# Patient Record
Sex: Male | Born: 1985 | Race: White | Hispanic: No | Marital: Single | Smoking: Current some day smoker
Health system: Southern US, Community
[De-identification: ages and names within clinical notes are randomized; demographics above are authoritative.]

---
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
## 2007-08-19 ENCOUNTER — Emergency Department (HOSPITAL_COMMUNITY): Admission: EM | Admit: 2007-08-19 | Discharge: 2007-08-19 | Payer: Self-pay | Admitting: Emergency Medicine

## 2016-10-20 ENCOUNTER — Emergency Department (HOSPITAL_COMMUNITY): Payer: Self-pay

## 2016-10-20 ENCOUNTER — Emergency Department (HOSPITAL_COMMUNITY)
Admission: EM | Admit: 2016-10-20 | Discharge: 2016-10-20 | Disposition: A | Discharge status: Home / Self Care | Payer: Self-pay | Attending: Emergency Medicine | Admitting: Emergency Medicine

## 2016-10-20 ENCOUNTER — Encounter (HOSPITAL_COMMUNITY): Payer: Self-pay | Admitting: *Deleted

## 2016-10-20 DIAGNOSIS — R1084 Generalized abdominal pain: Secondary | ICD-10-CM | POA: Insufficient documentation

## 2016-10-20 DIAGNOSIS — R042 Hemoptysis: Secondary | ICD-10-CM | POA: Insufficient documentation

## 2016-10-20 DIAGNOSIS — Z5181 Encounter for therapeutic drug level monitoring: Secondary | ICD-10-CM | POA: Insufficient documentation

## 2016-10-20 DIAGNOSIS — F17200 Nicotine dependence, unspecified, uncomplicated: Secondary | ICD-10-CM | POA: Insufficient documentation

## 2016-10-20 LAB — CBC WITH DIFFERENTIAL/PLATELET
BASOS ABS: 0 10*3/uL (ref 0.0–0.1)
BASOS PCT: 0 %
EOS PCT: 1 %
Eosinophils Absolute: 0.1 10*3/uL (ref 0.0–0.7)
HCT: 44.8 % (ref 39.0–52.0)
Hemoglobin: 15.4 g/dL (ref 13.0–17.0)
LYMPHS PCT: 15 %
Lymphs Abs: 2.1 10*3/uL (ref 0.7–4.0)
MCH: 30.5 pg (ref 26.0–34.0)
MCHC: 34.4 g/dL (ref 30.0–36.0)
MCV: 88.7 fL (ref 78.0–100.0)
MONO ABS: 1.3 10*3/uL — AB (ref 0.1–1.0)
Monocytes Relative: 10 %
Neutro Abs: 10.1 10*3/uL — ABNORMAL HIGH (ref 1.7–7.7)
Neutrophils Relative %: 74 %
PLATELETS: 215 10*3/uL (ref 150–400)
RBC: 5.05 MIL/uL (ref 4.22–5.81)
RDW: 12.1 % (ref 11.5–15.5)
WBC: 13.7 10*3/uL — AB (ref 4.0–10.5)

## 2016-10-20 LAB — COMPREHENSIVE METABOLIC PANEL
ALK PHOS: 56 U/L (ref 38–126)
ALT: 28 U/L (ref 17–63)
ANION GAP: 9 (ref 5–15)
AST: 30 U/L (ref 15–41)
Albumin: 4.3 g/dL (ref 3.5–5.0)
BILIRUBIN TOTAL: 1.1 mg/dL (ref 0.3–1.2)
BUN: 21 mg/dL — AB (ref 6–20)
CALCIUM: 9.1 mg/dL (ref 8.9–10.3)
CO2: 27 mmol/L (ref 22–32)
Chloride: 93 mmol/L — ABNORMAL LOW (ref 101–111)
Creatinine, Ser: 1.01 mg/dL (ref 0.61–1.24)
GFR calc Af Amer: >60 mL/min (ref >60)
Glucose, Bld: 101 mg/dL — ABNORMAL HIGH (ref 65–99)
POTASSIUM: 3.6 mmol/L (ref 3.5–5.1)
Sodium: 129 mmol/L — ABNORMAL LOW (ref 135–145)
TOTAL PROTEIN: 7.6 g/dL (ref 6.5–8.1)

## 2016-10-20 LAB — URINALYSIS, ROUTINE W REFLEX MICROSCOPIC
Bilirubin Urine: NEGATIVE
Glucose, UA: NEGATIVE mg/dL
Hgb urine dipstick: NEGATIVE
Ketones, ur: 20 mg/dL — AB
Leukocytes, UA: NEGATIVE
Nitrite: NEGATIVE
PROTEIN: 30 mg/dL — AB
SPECIFIC GRAVITY, URINE: 1.029 (ref 1.005–1.030)
pH: 5 (ref 5.0–8.0)

## 2016-10-20 LAB — RAPID URINE DRUG SCREEN, HOSP PERFORMED
AMPHETAMINES: POSITIVE — AB
Barbiturates: NOT DETECTED
Benzodiazepines: POSITIVE — AB
COCAINE: POSITIVE — AB
OPIATES: NOT DETECTED
TETRAHYDROCANNABINOL: POSITIVE — AB

## 2016-10-20 NOTE — ED Provider Notes (Addendum)
[facility]-EMER[facility]Y DEPT Provider Note   CSN: [id number] Arrival date & time: 10/20/16  0321  Time seen 03:42 AM   History   Chief Complaint Chief Complaint  Patient presents with  . Hemoptysis    HPI Timothy Black is a 31 y.o. male.  HPI  patient states he was sleeping and his girlfriend states she woke up and had blood all over her and realized he was vomiting or coughing up blood. He states this happened about 2 AM. He complains of chest pain but then he states it's in his upper back. He also has diffuse abdominal cramping. He denies any nosebleed. He states however his teeth and his throat had hurt all day long. He denies blood in his stool. 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 He states he "did it anyway you can do it".  States he hasn't felt well since. His significant other and sister state he's been under a lot of stress. His mother died in December about the same time he got out of prison. He denies epistaxis, coughing, nausea, vomiting, diarrhea.   PCP none  History reviewed. No pertinent past medical history.  There are no active problems to display for this patient.   History reviewed. No pertinent surgical history.     Home Medications    Prior to Admission medications   Not on File    Family History No family history on file.  Social History Social History  Substance Use Topics  . Smoking status: Current Every Day Smoker  . Smokeless tobacco: Never Used  . Alcohol use No  unemployed   Allergies   Patient has no known allergies.   Review of Systems Review of Systems  All other systems reviewed and are negative.    Physical Exam Updated Vital Signs BP 134/90   Pulse 95   Temp 98.2 F (36.8 C) (Oral)   Resp 18   Ht 5\' 11"  (1.803 m)   Wt 175 lb (79.4 kg)   SpO2 100%   BMI 24.41 kg/m   Vital signs normal    Physical Exam  Constitutional: He is oriented to person, place, and time. He appears  well-developed and well-nourished.  Non-toxic appearance. He does not appear ill. No distress.  Pt is sleeping, keeps falling asleep while talking  HENT:  Head: Normocephalic and atraumatic.  Right Ear: External ear normal.  Left Ear: External ear normal.  Nose: Nose normal. No mucosal edema or rhinorrhea.  Mouth/Throat: Oropharynx is clear and moist and mucous membranes are normal. No dental abscesses or uvula swelling.  No obvious sites of bleeding when his nasal septum is observed. There is no obvious blood in his posterior oropharynx. There is no blood in his oral pharynx. Patient is noted to have a large split in his right lower lip.  Eyes: Conjunctivae and EOM are normal. Pupils are equal, round, and reactive to light.  Neck: Normal range of motion and full passive range of motion without pain. Neck supple.  Cardiovascular: Normal rate, regular rhythm and normal heart sounds.  Exam reveals no gallop and no friction rub.   No murmur heard. Pulmonary/Chest: Effort normal and breath sounds normal. No respiratory distress. He has no wheezes. He has no rhonchi. He has no rales. He exhibits no tenderness and no crepitus.  Abdominal: Soft. Normal appearance and bowel sounds are normal. He exhibits no distension. There is generalized tenderness. There is no rebound and no guarding.  Musculoskeletal: Normal range of motion. He exhibits no edema or tenderness.  Moves all extremities well.   Neurological: He is alert and oriented to person, place, and time. He has normal strength. No cranial nerve deficit.  Skin: Skin is warm, dry and intact. No rash noted. No erythema. No pallor.  Psychiatric: He has a normal mood and affect. His speech is normal and behavior is normal. His mood appears not anxious.  Nursing note and vitals reviewed.    ED Treatments / Results  Labs (all labs ordered are listed, but only abnormal results are displayed) Results for orders placed or performed during the hospital  encounter of 10/20/16  Urinalysis, Routine w reflex microscopic  Result Value Ref Range   Color, Urine AMBER (A) YELLOW   APPearance CLOUDY (A) CLEAR   Specific Gravity, Urine 1.029 1.005 - 1.030   pH 5.0 5.0 - 8.0   Glucose, UA NEGATIVE NEGATIVE mg/dL   Hgb urine dipstick NEGATIVE NEGATIVE   Bilirubin Urine NEGATIVE NEGATIVE   Ketones, ur 20 (A) NEGATIVE mg/dL   Protein, ur 30 (A) NEGATIVE mg/dL   Nitrite NEGATIVE NEGATIVE   Leukocytes, UA NEGATIVE NEGATIVE   RBC / HPF 6-30 0 - 5 RBC/hpf   WBC, UA 6-30 0 - 5 WBC/hpf   Bacteria, UA RARE (A) NONE SEEN   Sperm, UA PRESENT   Urine rapid drug screen (hosp performed)  Result Value Ref Range   Opiates NONE DETECTED NONE DETECTED   Cocaine POSITIVE (A) NONE DETECTED   Benzodiazepines POSITIVE (A) NONE DETECTED   Amphetamines POSITIVE (A) NONE DETECTED   Tetrahydrocannabinol POSITIVE (A) NONE DETECTED   Barbiturates NONE DETECTED NONE DETECTED  Comprehensive metabolic panel  Result Value Ref Range   Sodium 129 (L) 135 - 145 mmol/L   Potassium 3.6 3.5 - 5.1 mmol/L   Chloride 93 (L) 101 - 111 mmol/L   CO2 27 22 - 32 mmol/L   Glucose, Bld 101 (H) 65 - 99 mg/dL   BUN 21 (H) 6 - 20 mg/dL   Creatinine, Ser 1.61 0.61 - 1.24 mg/dL   Calcium 9.1 8.9 - 09.6 mg/dL   Total Protein 7.6 6.5 - 8.1 g/dL   Albumin 4.3 3.5 - 5.0 g/dL   AST 30 15 - 41 U/L   ALT 28 17 - 63 U/L   Alkaline Phosphatase 56 38 - 126 U/L   Total Bilirubin 1.1 0.3 - 1.2 mg/dL   GFR calc non Af Amer >60 >60 mL/min   GFR calc Af Amer >60 >60 mL/min   Anion gap 9 5 - 15  CBC with Differential  Result Value Ref Range   WBC 13.7 (H) 4.0 - 10.5 K/uL   RBC 5.05 4.22 - 5.81 MIL/uL   Hemoglobin 15.4 13.0 - 17.0 g/dL   HCT 04.5 40.9 - 81.1 %   MCV 88.7 78.0 - 100.0 fL   MCH 30.5 26.0 - 34.0 pg   MCHC 34.4 30.0 - 36.0 g/dL   RDW 91.4 78.2 - 95.6 %   Platelets 215 150 - 400 K/uL   Neutrophils Relative % 74 %   Neutro Abs 10.1 (H) 1.7 - 7.7 K/uL   Lymphocytes Relative 15 %    Lymphs Abs 2.1 0.7 - 4.0 K/uL   Monocytes Relative 10 %   Monocytes Absolute 1.3 (H) 0.1 - 1.0 K/uL   Eosinophils Relative 1 %   Eosinophils Absolute 0.1 0.0 - 0.7 K/uL   Basophils Relative 0 %   Basophils Absolute  0.0 0.0 - 0.1 K/uL  hemoccult negative per his nurse   Laboratory interpretation all normal except leukocytosis, mild hyponatremia, + UDS    EKG  EKG Interpretation None       Radiology Dg Chest 2 View  Result Date: 10/20/2016 CLINICAL DATA:  31 y/o M; coughing up blood, chest pain, abdominal pain. EXAM: CHEST  2 VIEW COMPARISON:  08/18/2011 chest radiograph. FINDINGS: The heart size and mediastinal contours are within normal limits. Right costal diaphragmatic angle opacity is probably a nipple shadow. Both lungs are clear. The visualized skeletal structures are unremarkable. IMPRESSION: No active cardiopulmonary disease. Electronically Signed   By: Mitzi Hansen M.D.   On: 10/20/2016 06:17   Dg Abd 2 Views  Result Date: 10/20/2016 CLINICAL DATA:  31 y/o M; coughing up blood, chest pain, abdominal pain. EXAM: ABDOMEN - 2 VIEW COMPARISON:  11/10/2015 pelvis radiographs. FINDINGS: The bowel gas pattern is normal. There is no evidence of free air. No radio-opaque calculi or other significant radiographic abnormality is seen. IMPRESSION: Negative. Electronically Signed   By: Mitzi Hansen M.D.   On: 10/20/2016 06:16    Procedures Procedures (including critical care time)  Medications Ordered in ED Medications - No data to display   Initial Impression / Assessment and Plan / ED Course  I have reviewed the triage vital signs and the nursing notes.  Pertinent labs & imaging results that were available during my care of the patient were reviewed by me and considered in my medical decision making (see chart for details).   Recheck at 06:30 am patient has been sleeping his whole visit. No more bleeding. States he feels better, tests are normal. I  still suspect the blood is from the split in his right lower lip.    Final Clinical Impressions(s) / ED Diagnoses   Final diagnoses:  Coughing up blood    Plan discharge  Devoria Albe, MD, Concha Pyo, MD 10/20/16 0700    Devoria Albe, MD 10/20/16 [phone]

## 2016-10-20 NOTE — ED Triage Notes (Signed)
Pt c/o waking up to coughing blood, denies any fever, states that he was not sick until tonight,

## 2016-10-20 NOTE — Discharge Instructions (Signed)
Drink plenty of fluids. Use lip balm on the split of your lower lip. Recheck if you seem worse.

## 2019-05-30 ENCOUNTER — Encounter (HOSPITAL_COMMUNITY): Payer: Self-pay

## 2019-05-30 ENCOUNTER — Emergency Department (HOSPITAL_COMMUNITY): Payer: Self-pay

## 2019-05-30 ENCOUNTER — Emergency Department (HOSPITAL_COMMUNITY)
Admission: EM | Admit: 2019-05-30 | Discharge: 2019-05-30 | Discharge status: Against Medical Advice | Payer: Self-pay | Attending: Emergency Medicine | Admitting: Emergency Medicine

## 2019-05-30 ENCOUNTER — Other Ambulatory Visit: Payer: Self-pay

## 2019-05-30 DIAGNOSIS — F17200 Nicotine dependence, unspecified, uncomplicated: Secondary | ICD-10-CM | POA: Insufficient documentation

## 2019-05-30 DIAGNOSIS — Z5329 Procedure and treatment not carried out because of patient's decision for other reasons: Secondary | ICD-10-CM | POA: Insufficient documentation

## 2019-05-30 DIAGNOSIS — L03115 Cellulitis of right lower limb: Secondary | ICD-10-CM | POA: Insufficient documentation

## 2019-05-30 MED ORDER — IBUPROFEN 400 MG PO TABS
400.0000 mg | ORAL_TABLET | Freq: Once | ORAL | Status: AC
  Administered 2019-05-30: 400 mg via ORAL
  Filled 2019-05-30: qty 1

## 2019-05-30 MED ORDER — ACETAMINOPHEN 325 MG PO TABS
650.0000 mg | ORAL_TABLET | Freq: Once | ORAL | Status: AC
  Administered 2019-05-30: 650 mg via ORAL
  Filled 2019-05-30: qty 2

## 2019-05-30 MED ORDER — CLINDAMYCIN HCL 150 MG PO CAPS
450.0000 mg | ORAL_CAPSULE | Freq: Once | ORAL | Status: AC
  Administered 2019-05-30: 450 mg via ORAL
  Filled 2019-05-30: qty 3

## 2019-05-30 NOTE — ED Notes (Signed)
Right foot soak in [facility].

## 2019-05-30 NOTE — ED Notes (Signed)
Pt is wanting to leave.  Says his brother is about to leave him and he on house arrest.  Medications given as ordered.  Eplained to pt the importance of staying to complete his treated.  Pt says he has to go and signed out AMA.  Dr Thurnell Garbe and charge nurse informed.

## 2019-05-30 NOTE — ED Triage Notes (Signed)
Pt reports rash and wounds to r foot.  Redness and swelling noted to r foot.  Pt says went to [facility] and was given a cream for his foot but was unable to get it filled.

## 2019-05-30 NOTE — ED Provider Notes (Signed)
[facility] Provider Note   CSN: [id number] Arrival date & time: 05/30/19  7829     History   Chief Complaint Chief Complaint  Patient presents with   Wound Infection    HPI Timothy Black is a 33 y.o. male.     HPI  Pt was seen at 0740. Per pt, c/o gradual onset and worsening of persistent "blisters" to his right foot for the past 3 days. Pt states he was "running really fast on concrete with no shoes" and "got blisters" on his feet. Pt states he was evaluated at OSH, had Td updated, and was rx "a cream" that he did not get filled "because I just got out of jail." Pt states he took off his sock this morning and noticed "a bunch of bug bites" on his right dorsal foot "after being in the woods," as well as "redness" to his 4th toe. Denies new injury, no fever, no other rash, no focal motor weakness, no tingling/numbness in extremities.    Td UTD History reviewed. No pertinent past medical history.  There are no active problems to display for this patient.   History reviewed. No pertinent surgical history.      Home Medications    Prior to Admission medications   Not on File    Family History No family history on file.  Social History Social History   Tobacco Use   Smoking status: Current Every Day Smoker   Smokeless tobacco: Never Used  Substance Use Topics   Alcohol use: No   Drug use: Yes    Types: Marijuana    Comment: crystal meth     Allergies   Patient has no known allergies.   Review of Systems Review of Systems ROS: Statement: All systems negative except as marked or noted in the HPI; Constitutional: Negative for fever and chills. ; ; Eyes: Negative for eye pain, redness and discharge. ; ; ENMT: Negative for ear pain, hoarseness, nasal congestion, sinus pressure and sore throat. ; ; Cardiovascular: Negative for chest pain, palpitations, diaphoresis, dyspnea and peripheral edema. ; ; Respiratory: Negative for cough,  wheezing and stridor. ; ; Gastrointestinal: Negative for nausea, vomiting, diarrhea, abdominal pain, blood in stool, hematemesis, jaundice and rectal bleeding. . ; ; Genitourinary: Negative for dysuria, flank pain and hematuria. ; ; Musculoskeletal: Negative for back pain and neck pain. Negative for swelling and trauma.; ; Skin: Negative for pruritus, abrasions, bruising and +rash, blisters, skin lesion.; ; Neuro: Negative for headache, lightheadedness and neck stiffness. Negative for weakness, altered level of consciousness, altered mental status, extremity weakness, paresthesias, involuntary movement, seizure and syncope.       Physical Exam Updated Vital Signs BP 109/77 (BP Location: Left Arm)    Pulse (!) 103    Temp 98 F (36.7 C) (Oral)    Resp 20    Ht 5\' 11"  (1.803 m)    Wt 86.2 kg    BMI 26.50 kg/m   Physical Exam 0745: Physical examination:  Nursing notes reviewed; Vital signs and O2 SAT reviewed;  Constitutional: Well developed, Well nourished, Well hydrated, In no acute distress; Head:  Normocephalic, atraumatic; Eyes: EOMI, PERRL, No scleral icterus; ENMT: Mouth and pharynx normal, Mucous membranes moist; Neck: Supple, Full range of motion, No lymphadenopathy; Cardiovascular: Regular rate and rhythm, No gallop; Respiratory: Breath sounds clear & equal bilaterally, No wheezes.  Speaking full sentences with ease, Normal respiratory effort/excursion; Chest: Nontender, Movement normal; Abdomen: Soft, Nontender, Nondistended, Normal bowel sounds;  Genitourinary: No CVA tenderness; Extremities: Peripheral pulses normal, +right dorsal foot with scattered small round lesions that appear c/w insect bites, +scattered open blisters to plantar right foot/heel/toes, no ecchymosis, +right 4th toe with dorsal erythema streaking up distal dorsal foot without plantar erythema, +open blister to plantar toe, no soft tissue crepitus, no visualized or palp FB. No deformity. No right ankle tenderness. No calf  tenderness, edema or asymmetry. +left plantar foot with scattered open blisters, no erythema, no ecchymosis. Neuro: AA&Ox3, Major CN grossly intact.  Speech clear. No gross focal motor or sensory deficits in extremities.; Skin: Color normal, Warm, Dry.   ED Treatments / Results  Labs (all labs ordered are listed, but only abnormal results are displayed)   EKG None  Radiology   Procedures Procedures (including critical care time)  Medications Ordered in ED Medications - No data to display   Initial Impression / Assessment and Plan / ED Course  I have reviewed the triage vital signs and the nursing notes.  Pertinent labs & imaging results that were available during my care of the patient were reviewed by me and considered in my medical decision making (see chart for details).     MDM Reviewed: previous chart, nursing note and vitals Interpretation: x-ray   Dg Foot Complete Right Result Date: 05/30/2019 CLINICAL DATA:  Blisters, redness and swelling.  Both bites. EXAM: RIGHT FOOT COMPLETE - 3+ VIEW COMPARISON:  None. FINDINGS: Nonspecific soft tissue swelling. No evidence of fracture or dislocation. The patient does have a linear metallic foreign object in the soft tissues lateral to the calcaneus, consistent with a small wire fragment. IMPRESSION: Nonspecific soft tissue swelling. No bone abnormality. Small metallic density foreign object in the soft tissues lateral to the calcaneus. Electronically Signed   By: Paulina FusiMark  Shogry M.D.   On: 05/30/2019 08:26    [name] was evaluated in Emergency Department on 05/30/2019 for the symptoms described in the history of present illness. He was evaluated in the context of the global COVID-19 pandemic, which necessitated consideration that the patient might be at risk for infection with the SARS-CoV-2 virus that causes COVID-19. Institutional protocols and algorithms that pertain to the evaluation of patients at risk for COVID-19 are in a  state of rapid change based on information released by regulatory bodies including the CDC and federal and state organizations. These policies and algorithms were followed during the patient's care in the ED.   0930:  XR as above: where metallic density is noted, the skin is intact and there is no wound, tenderness, or erythema. FB seen on XR is not palpable. There is an unroofed blister just distally to where XR shows FB. Given lack of s/s recent trauma or infection, FB likely old and will refer to Ortho MD. Wound care provided. Will give 1st dose abx.  0945:  Pt was seen walking down the hallway to leave the ED, yelling something about his brother. Pt encouraged by staff to stay, but pt continued to walk out of the ED.      Final Clinical Impressions(s) / ED Diagnoses   Final diagnoses:  None    ED Discharge Orders    None       Samuel JesterMcManus, Khristi Schiller, DO 06/03/19 1531

## 2020-08-30 ENCOUNTER — Emergency Department (HOSPITAL_COMMUNITY)

## 2020-08-30 ENCOUNTER — Encounter (HOSPITAL_COMMUNITY): Payer: Self-pay | Admitting: Emergency Medicine

## 2020-08-30 ENCOUNTER — Emergency Department (HOSPITAL_COMMUNITY)
Admission: EM | Admit: 2020-08-30 | Discharge: 2020-08-30 | Disposition: A | Discharge status: Home / Self Care | Attending: Emergency Medicine | Admitting: Emergency Medicine

## 2020-08-30 ENCOUNTER — Other Ambulatory Visit: Payer: Self-pay

## 2020-08-30 DIAGNOSIS — S22080D Wedge compression fracture of T11-T12 vertebra, subsequent encounter for fracture with routine healing: Secondary | ICD-10-CM

## 2020-08-30 DIAGNOSIS — S32010D Wedge compression fracture of first lumbar vertebra, subsequent encounter for fracture with routine healing: Secondary | ICD-10-CM

## 2020-08-30 DIAGNOSIS — S42322G Displaced transverse fracture of shaft of humerus, left arm, subsequent encounter for fracture with delayed healing: Secondary | ICD-10-CM

## 2020-08-30 DIAGNOSIS — Y92149 Unspecified place in prison as the place of occurrence of the external cause: Secondary | ICD-10-CM | POA: Insufficient documentation

## 2020-08-30 DIAGNOSIS — W19XXXA Unspecified fall, initial encounter: Secondary | ICD-10-CM

## 2020-08-30 DIAGNOSIS — S42322D Displaced transverse fracture of shaft of humerus, left arm, subsequent encounter for fracture with routine healing: Secondary | ICD-10-CM | POA: Insufficient documentation

## 2020-08-30 DIAGNOSIS — F17200 Nicotine dependence, unspecified, uncomplicated: Secondary | ICD-10-CM | POA: Insufficient documentation

## 2020-08-30 DIAGNOSIS — S4992XD Unspecified injury of left shoulder and upper arm, subsequent encounter: Secondary | ICD-10-CM | POA: Diagnosis present

## 2020-08-30 DIAGNOSIS — W06XXXA Fall from bed, initial encounter: Secondary | ICD-10-CM

## 2020-08-30 DIAGNOSIS — S2243XD Multiple fractures of ribs, bilateral, subsequent encounter for fracture with routine healing: Secondary | ICD-10-CM

## 2020-08-30 DIAGNOSIS — W06XXXD Fall from bed, subsequent encounter: Secondary | ICD-10-CM | POA: Insufficient documentation

## 2020-08-30 MED ORDER — KETOROLAC TROMETHAMINE 30 MG/ML IJ SOLN
30.0000 mg | Freq: Once | INTRAMUSCULAR | Status: AC
  Administered 2020-08-30: 30 mg via INTRAMUSCULAR
  Filled 2020-08-30: qty 1

## 2020-08-30 MED ORDER — CYCLOBENZAPRINE HCL 10 MG PO TABS
5.0000 mg | ORAL_TABLET | Freq: Once | ORAL | Status: AC
  Administered 2020-08-30: 5 mg via ORAL
  Filled 2020-08-30: qty 1
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
## 2020-08-30 NOTE — ED Notes (Addendum)
Discharge instructions reviewed with pt and Alona Bene, [employer] Deputy at bedside. Attempted to call report to [facility] Nurse x 2 but had to leave voicemail due to no nurse answering ([facility] was contacted and said nurse could be passing meds at the time but to leave a voicemail and the nurse would call back for report). [employer] Deputy at bedside reported we could discharge pt with her back to jail and the jail nurse could call us back for report when she was available. Pt has splint to left arm. Splint instructions, pain management, discharge follow up given to pt and [employer] Deputy at bedside.

## 2020-08-30 NOTE — ED Notes (Signed)
Patient transported to CT 

## 2020-08-30 NOTE — Discharge Instructions (Addendum)
Wear the splint for comfort.  Follow-up with orthopedics for your humerus fracture that is not healing well.  Continue taking ibuprofen and acetaminophen for pain.

## 2020-09-13 ENCOUNTER — Ambulatory Visit

## 2020-09-13 ENCOUNTER — Encounter: Payer: Self-pay | Admitting: Orthopedic Surgery

## 2020-09-13 ENCOUNTER — Ambulatory Visit (INDEPENDENT_AMBULATORY_CARE_PROVIDER_SITE_OTHER): Admitting: Orthopedic Surgery

## 2020-09-13 ENCOUNTER — Other Ambulatory Visit: Payer: Self-pay

## 2020-09-13 VITALS — BP 133/82 | HR 93 | Ht 71.0 in | Wt 170.0 lb

## 2020-09-13 DIAGNOSIS — S42392A Other fracture of shaft of left humerus, initial encounter for closed fracture: Secondary | ICD-10-CM | POA: Diagnosis not present

## 2020-09-13 DIAGNOSIS — G8929 Other chronic pain: Secondary | ICD-10-CM | POA: Diagnosis not present

## 2020-09-13 DIAGNOSIS — G5632 Lesion of radial nerve, left upper limb: Secondary | ICD-10-CM | POA: Diagnosis not present

## 2020-09-13 DIAGNOSIS — M25512 Pain in left shoulder: Secondary | ICD-10-CM
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
## 2020-09-13 NOTE — Patient Instructions (Addendum)
1.  Brace on left arm at all times 2.  Splint on left wrist at all times 3.  Tylenol and ibuprofen as needed 4.  Referral to OT for resting wrist splint 5.  F/u 6 weeks for repeat evaluation

## 2020-09-19 ENCOUNTER — Other Ambulatory Visit: Payer: Self-pay

## 2020-09-19 ENCOUNTER — Encounter (HOSPITAL_COMMUNITY): Payer: Self-pay

## 2020-09-19 ENCOUNTER — Ambulatory Visit (HOSPITAL_COMMUNITY): Attending: Orthopedic Surgery

## 2020-09-19 DIAGNOSIS — R278 Other lack of coordination: Secondary | ICD-10-CM | POA: Insufficient documentation

## 2020-09-19 DIAGNOSIS — M25532 Pain in left wrist: Secondary | ICD-10-CM | POA: Insufficient documentation

## 2020-09-19 DIAGNOSIS — R29898 Other symptoms and signs involving the musculoskeletal system: Secondary | ICD-10-CM | POA: Insufficient documentation

## 2020-09-19 NOTE — Patient Instructions (Signed)
Your Splint This splint should initially be fitted by a healthcare practitioner.  The healthcare practitioner is responsible for providing wearing instructions and precautions to the patient, other healthcare practitioners and care provider involved in the patient's care.  This splint was custom made for you. Please read the following instructions to learn about wearing and caring for your splint.  Precautions Should your splint cause any of the following problems, remove the splint immediately and contact your therapist/physician.  Swelling  Severe Pain  Pressure Areas  Stiffness  Numbness  Do not wear your splint while operating machinery unless it has been fabricated for that purpose.  When To Wear Your Splint Where your splint according to your therapist/physician instructions. All the time. May remove when bathing.   Care and Cleaning of Your Splint 1. Keep your splint away from open flames. 2. Your splint will lose its shape in temperatures over 135 degrees Farenheit, ( in car windows, near radiators, ovens or in hot water).  Never make any adjustments to your splint, if the splint needs adjusting remove it and make an appointment to see your therapist. 3. Your splint, including the cushion liner may be cleaned with soap and lukewarm water.  Do not immerse in hot water over 135 degrees Farenheit. 4. Straps may be washed with soap and water, but do not moisten the self-adhesive portion. 5. For ink or hard to remove spots use a scouring cleanser which contains chlorine.  Rinse the splint thoroughly after using chlorine cleanser.
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
## 2022-10-06 ENCOUNTER — Emergency Department (HOSPITAL_COMMUNITY): Payer: Medicaid Other

## 2022-10-06 ENCOUNTER — Encounter (HOSPITAL_COMMUNITY): Admission: EM | Discharge status: Against Medical Advice | Payer: Self-pay | Source: Home / Self Care

## 2022-10-06 ENCOUNTER — Emergency Department (HOSPITAL_COMMUNITY): Payer: Medicaid Other | Admitting: Registered Nurse

## 2022-10-06 ENCOUNTER — Encounter (HOSPITAL_COMMUNITY): Payer: Self-pay | Admitting: Emergency Medicine

## 2022-10-06 ENCOUNTER — Inpatient Hospital Stay (HOSPITAL_COMMUNITY)
Admission: EM | Admit: 2022-10-06 | Discharge: 2022-11-05 | DRG: 963 | Discharge status: Against Medical Advice | Payer: Medicaid Other

## 2022-10-06 ENCOUNTER — Other Ambulatory Visit: Payer: Self-pay

## 2022-10-06 DIAGNOSIS — S36115A Moderate laceration of liver, initial encounter: Secondary | ICD-10-CM | POA: Diagnosis not present

## 2022-10-06 DIAGNOSIS — S2241XB Multiple fractures of ribs, right side, initial encounter for open fracture: Secondary | ICD-10-CM | POA: Diagnosis not present

## 2022-10-06 DIAGNOSIS — J13 Pneumonia due to Streptococcus pneumoniae: Secondary | ICD-10-CM | POA: Diagnosis not present

## 2022-10-06 DIAGNOSIS — S37001A Unspecified injury of right kidney, initial encounter: Secondary | ICD-10-CM | POA: Diagnosis present

## 2022-10-06 DIAGNOSIS — T148XXA Other injury of unspecified body region, initial encounter: Secondary | ICD-10-CM | POA: Diagnosis present

## 2022-10-06 DIAGNOSIS — S36531A Laceration of transverse colon, initial encounter: Secondary | ICD-10-CM | POA: Diagnosis present

## 2022-10-06 DIAGNOSIS — R739 Hyperglycemia, unspecified: Secondary | ICD-10-CM | POA: Diagnosis not present

## 2022-10-06 DIAGNOSIS — Z5329 Procedure and treatment not carried out because of patient's decision for other reasons: Secondary | ICD-10-CM | POA: Diagnosis present

## 2022-10-06 DIAGNOSIS — S3690XA Unspecified injury of unspecified intra-abdominal organ, initial encounter: Secondary | ICD-10-CM | POA: Diagnosis not present

## 2022-10-06 DIAGNOSIS — D62 Acute posthemorrhagic anemia: Secondary | ICD-10-CM | POA: Diagnosis present

## 2022-10-06 DIAGNOSIS — F1120 Opioid dependence, uncomplicated: Secondary | ICD-10-CM | POA: Diagnosis present

## 2022-10-06 DIAGNOSIS — W3400XA Accidental discharge from unspecified firearms or gun, initial encounter: Principal | ICD-10-CM

## 2022-10-06 DIAGNOSIS — S3663XA Laceration of rectum, initial encounter: Secondary | ICD-10-CM | POA: Diagnosis present

## 2022-10-06 DIAGNOSIS — F4310 Post-traumatic stress disorder, unspecified: Secondary | ICD-10-CM | POA: Diagnosis not present

## 2022-10-06 DIAGNOSIS — J9601 Acute respiratory failure with hypoxia: Secondary | ICD-10-CM | POA: Diagnosis not present

## 2022-10-06 DIAGNOSIS — J930 Spontaneous tension pneumothorax: Secondary | ICD-10-CM | POA: Diagnosis not present

## 2022-10-06 DIAGNOSIS — S271XXA Traumatic hemothorax, initial encounter: Secondary | ICD-10-CM | POA: Diagnosis present

## 2022-10-06 DIAGNOSIS — Z9889 Other specified postprocedural states: Secondary | ICD-10-CM

## 2022-10-06 DIAGNOSIS — S31630A Puncture wound without foreign body of abdominal wall, right upper quadrant with penetration into peritoneal cavity, initial encounter: Secondary | ICD-10-CM | POA: Diagnosis not present

## 2022-10-06 DIAGNOSIS — R609 Edema, unspecified: Secondary | ICD-10-CM | POA: Diagnosis not present

## 2022-10-06 DIAGNOSIS — K5909 Other constipation: Secondary | ICD-10-CM | POA: Diagnosis present

## 2022-10-06 DIAGNOSIS — Z23 Encounter for immunization: Secondary | ICD-10-CM | POA: Diagnosis not present

## 2022-10-06 DIAGNOSIS — S36501A Unspecified injury of transverse colon, initial encounter: Secondary | ICD-10-CM

## 2022-10-06 DIAGNOSIS — F17210 Nicotine dependence, cigarettes, uncomplicated: Secondary | ICD-10-CM | POA: Diagnosis present

## 2022-10-06 DIAGNOSIS — D649 Anemia, unspecified: Secondary | ICD-10-CM | POA: Diagnosis not present

## 2022-10-06 DIAGNOSIS — F17200 Nicotine dependence, unspecified, uncomplicated: Secondary | ICD-10-CM

## 2022-10-06 DIAGNOSIS — F329 Major depressive disorder, single episode, unspecified: Secondary | ICD-10-CM | POA: Diagnosis not present

## 2022-10-06 DIAGNOSIS — S3639XA Other injury of stomach, initial encounter: Secondary | ICD-10-CM | POA: Diagnosis not present

## 2022-10-06 DIAGNOSIS — I959 Hypotension, unspecified: Secondary | ICD-10-CM | POA: Diagnosis present

## 2022-10-06 DIAGNOSIS — S21131A Puncture wound without foreign body of right front wall of thorax without penetration into thoracic cavity, initial encounter: Principal | ICD-10-CM | POA: Diagnosis present

## 2022-10-06 DIAGNOSIS — Z046 Encounter for general psychiatric examination, requested by authority: Secondary | ICD-10-CM | POA: Diagnosis not present

## 2022-10-06 DIAGNOSIS — F1510 Other stimulant abuse, uncomplicated: Secondary | ICD-10-CM | POA: Diagnosis present

## 2022-10-06 HISTORY — PX: LAPAROTOMY: SHX154

## 2022-10-06 LAB — URINALYSIS, ROUTINE W REFLEX MICROSCOPIC
Bilirubin Urine: NEGATIVE
Glucose, UA: NEGATIVE mg/dL
Hgb urine dipstick: NEGATIVE
Ketones, ur: NEGATIVE mg/dL
Leukocytes,Ua: NEGATIVE
Nitrite: NEGATIVE
Protein, ur: 30 mg/dL — AB
Specific Gravity, Urine: 1.028 (ref 1.005–1.030)
pH: 6 (ref 5.0–8.0)

## 2022-10-06 LAB — I-STAT CHEM 8, ED
BUN: 16 mg/dL (ref 6–20)
Calcium, Ion: 0.95 mmol/L — ABNORMAL LOW (ref 1.15–1.40)
Chloride: 98 mmol/L (ref 98–111)
Creatinine, Ser: 1.1 mg/dL (ref 0.61–1.24)
Glucose, Bld: 179 mg/dL — ABNORMAL HIGH (ref 70–99)
HCT: 35 % — ABNORMAL LOW (ref 39.0–52.0)
Hemoglobin: 11.9 g/dL — ABNORMAL LOW (ref 13.0–17.0)
Potassium: 3.7 mmol/L (ref 3.5–5.1)
Sodium: 135 mmol/L (ref 135–145)
TCO2: 22 mmol/L (ref 22–32)

## 2022-10-06 LAB — COMPREHENSIVE METABOLIC PANEL
ALT: 136 U/L — ABNORMAL HIGH (ref 0–44)
AST: 153 U/L — ABNORMAL HIGH (ref 15–41)
Albumin: 3 g/dL — ABNORMAL LOW (ref 3.5–5.0)
Alkaline Phosphatase: 55 U/L (ref 38–126)
Anion gap: 14 (ref 5–15)
BUN: 12 mg/dL (ref 6–20)
CO2: 18 mmol/L — ABNORMAL LOW (ref 22–32)
Calcium: 8.1 mg/dL — ABNORMAL LOW (ref 8.9–10.3)
Chloride: 102 mmol/L (ref 98–111)
Creatinine, Ser: 1.21 mg/dL (ref 0.61–1.24)
GFR, Estimated: >60 mL/min (ref >60)
Glucose, Bld: 184 mg/dL — ABNORMAL HIGH (ref 70–99)
Potassium: 4.1 mmol/L (ref 3.5–5.1)
Sodium: 134 mmol/L — ABNORMAL LOW (ref 135–145)
Total Bilirubin: 1.1 mg/dL (ref 0.3–1.2)
Total Protein: 5.8 g/dL — ABNORMAL LOW (ref 6.5–8.1)

## 2022-10-06 LAB — CBC
HCT: 35.6 % — ABNORMAL LOW (ref 39.0–52.0)
Hemoglobin: 11.4 g/dL — ABNORMAL LOW (ref 13.0–17.0)
MCH: 30.2 pg (ref 26.0–34.0)
MCHC: 32 g/dL (ref 30.0–36.0)
MCV: 94.2 fL (ref 80.0–100.0)
Platelets: 287 10*3/uL (ref 150–400)
RBC: 3.78 MIL/uL — ABNORMAL LOW (ref 4.22–5.81)
RDW: 13.3 % (ref 11.5–15.5)
WBC: 19.8 10*3/uL — ABNORMAL HIGH (ref 4.0–10.5)
nRBC: 0 % (ref 0.0–0.2)

## 2022-10-06 LAB — ETHANOL: Alcohol, Ethyl (B): <10 mg/dL (ref <10)

## 2022-10-06 LAB — LACTIC ACID, PLASMA: Lactic Acid, Venous: 2.7 mmol/L (ref 0.5–1.9)

## 2022-10-06 LAB — ABO/RH: ABO/RH(D): A POS

## 2022-10-06 SURGERY — LAPAROTOMY, EXPLORATORY
Anesthesia: General | Site: Abdomen

## 2022-10-06 MED ORDER — PHENYLEPHRINE HCL-NACL 20-0.9 MG/250ML-% IV SOLN
INTRAVENOUS | Status: DC | PRN
  Administered 2022-10-06: 30 ug/min via INTRAVENOUS

## 2022-10-06 MED ORDER — PROPOFOL 10 MG/ML IV BOLUS
INTRAVENOUS | Status: DC | PRN
  Administered 2022-10-06: 50 mg via INTRAVENOUS

## 2022-10-06 MED ORDER — HYDROMORPHONE HCL 1 MG/ML IJ SOLN
INTRAMUSCULAR | Status: DC | PRN
  Administered 2022-10-06: .5 mg via INTRAVENOUS

## 2022-10-06 MED ORDER — LIDOCAINE 2% (20 MG/ML) 5 ML SYRINGE
INTRAMUSCULAR | Status: DC | PRN
  Administered 2022-10-06: 60 mg via INTRAVENOUS

## 2022-10-06 MED ORDER — IOHEXOL 9 MG/ML PO SOLN: 500.0000 mL | ORAL | Status: DC

## 2022-10-06 MED ORDER — SUCCINYLCHOLINE CHLORIDE 200 MG/10ML IV SOSY
PREFILLED_SYRINGE | INTRAVENOUS | Status: DC | PRN
  Administered 2022-10-06: 80 mg via INTRAVENOUS

## 2022-10-06 MED ORDER — ROCURONIUM BROMIDE 10 MG/ML (PF) SYRINGE
PREFILLED_SYRINGE | INTRAVENOUS | Status: DC | PRN
  Administered 2022-10-06 (×2): 20 mg via INTRAVENOUS
  Administered 2022-10-06 – 2022-10-07 (×2): 30 mg via INTRAVENOUS

## 2022-10-06 MED ORDER — LACTATED RINGERS IV SOLN: INTRAVENOUS | Status: DC | PRN

## 2022-10-06 MED ORDER — FENTANYL CITRATE PF 50 MCG/ML IJ SOSY: 100.0000 ug | PREFILLED_SYRINGE | Freq: Once | INTRAMUSCULAR | Status: DC

## 2022-10-06 MED ORDER — FENTANYL CITRATE (PF) 100 MCG/2ML IJ SOLN
INTRAMUSCULAR | Status: AC
  Administered 2022-10-06: 100 ug
  Filled 2022-10-06: qty 2

## 2022-10-06 MED ORDER — TRANEXAMIC ACID-NACL 1000-0.7 MG/100ML-% IV SOLN
1000.0000 mg | Freq: Once | INTRAVENOUS | Status: AC
  Administered 2022-10-06: 1000 mg via INTRAVENOUS

## 2022-10-06 MED ORDER — FENTANYL CITRATE (PF) 250 MCG/5ML IJ SOLN
INTRAMUSCULAR | Status: AC
  Filled 2022-10-06: qty 5

## 2022-10-06 MED ORDER — TRANEXAMIC ACID 1000 MG/10ML IV SOLN
1000.0000 mg | Freq: Once | INTRAVENOUS | Status: AC
  Administered 2022-10-06: 1000 mg via INTRAVENOUS
  Filled 2022-10-06: qty 10

## 2022-10-06 MED ORDER — PROPOFOL 10 MG/ML IV BOLUS
INTRAVENOUS | Status: AC
  Filled 2022-10-06: qty 20

## 2022-10-06 MED ORDER — TETANUS-DIPHTH-ACELL PERTUSSIS 5-2.5-18.5 LF-MCG/0.5 IM SUSY: 0.5000 mL | PREFILLED_SYRINGE | Freq: Once | INTRAMUSCULAR | Status: DC

## 2022-10-06 MED ORDER — FENTANYL CITRATE (PF) 100 MCG/2ML IJ SOLN
INTRAMUSCULAR | Status: AC
  Filled 2022-10-06: qty 2

## 2022-10-06 MED ORDER — PHENYLEPHRINE 80 MCG/ML (10ML) SYRINGE FOR IV PUSH (FOR BLOOD PRESSURE SUPPORT): PREFILLED_SYRINGE | INTRAVENOUS | Status: DC | PRN

## 2022-10-06 MED ORDER — DEXAMETHASONE SODIUM PHOSPHATE 10 MG/ML IJ SOLN
INTRAMUSCULAR | Status: DC | PRN
  Administered 2022-10-06: 5 mg via INTRAVENOUS

## 2022-10-06 MED ORDER — FENTANYL CITRATE (PF) 250 MCG/5ML IJ SOLN
INTRAMUSCULAR | Status: DC | PRN
  Administered 2022-10-06: 150 ug via INTRAVENOUS
  Administered 2022-10-06: 100 ug via INTRAVENOUS

## 2022-10-06 MED ORDER — IOHEXOL 350 MG/ML SOLN
100.0000 mL | Freq: Once | INTRAVENOUS | Status: AC | PRN
  Administered 2022-10-06: 100 mL via INTRAVENOUS

## 2022-10-06 MED ORDER — HYDROMORPHONE HCL 1 MG/ML IJ SOLN
INTRAMUSCULAR | Status: AC
  Filled 2022-10-06: qty 0.5

## 2022-10-06 MED ORDER — SODIUM CHLORIDE 0.9 % IV SOLN: INTRAVENOUS | Status: DC | PRN

## 2022-10-06 MED ORDER — IOHEXOL 350 MG/ML SOLN
75.0000 mL | Freq: Once | INTRAVENOUS | Status: AC | PRN
  Administered 2022-10-06: 75 mL via INTRAVENOUS

## 2022-10-06 MED ORDER — MIDAZOLAM HCL 2 MG/2ML IJ SOLN
INTRAMUSCULAR | Status: AC
  Filled 2022-10-06: qty 2

## 2022-10-06 MED ORDER — CEFAZOLIN SODIUM-DEXTROSE 2-4 GM/100ML-% IV SOLN
2.0000 g | Freq: Once | INTRAVENOUS | Status: AC
  Administered 2022-10-06: 2 g via INTRAVENOUS

## 2022-10-06 SURGICAL SUPPLY — 44 items
BAG COUNTER SPONGE SURGICOUNT (BAG) ×1 IMPLANT
BLADE CLIPPER SURG (BLADE) IMPLANT
CANISTER SUCT 3000ML PPV (MISCELLANEOUS) ×1 IMPLANT
CHLORAPREP W/TINT 26 (MISCELLANEOUS) ×1 IMPLANT
COVER SURGICAL LIGHT HANDLE (MISCELLANEOUS) ×1 IMPLANT
DRAPE LAPAROSCOPIC ABDOMINAL (DRAPES) ×1 IMPLANT
DRAPE WARM FLUID 44X44 (DRAPES) ×1 IMPLANT
DRSG OPSITE POSTOP 4X12 (GAUZE/BANDAGES/DRESSINGS) IMPLANT
ELECT BLADE 4.0 EZ CLEAN MEGAD (MISCELLANEOUS) ×1
ELECT BLADE 6.5 EXT (BLADE) IMPLANT
ELECT REM PT RETURN 9FT ADLT (ELECTROSURGICAL) ×1
ELECTRODE BLDE 4.0 EZ CLN MEGD (MISCELLANEOUS) IMPLANT
ELECTRODE REM PT RTRN 9FT ADLT (ELECTROSURGICAL) ×1 IMPLANT
GLOVE BIO SURGEON STRL SZ7.5 (GLOVE) ×1 IMPLANT
GLOVE INDICATOR 8.0 STRL GRN (GLOVE) ×1 IMPLANT
GOWN STRL REUS W/ TWL LRG LVL3 (GOWN DISPOSABLE) ×1 IMPLANT
GOWN STRL REUS W/ TWL XL LVL3 (GOWN DISPOSABLE) ×1 IMPLANT
GOWN STRL REUS W/TWL LRG LVL3 (GOWN DISPOSABLE) ×1
GOWN STRL REUS W/TWL XL LVL3 (GOWN DISPOSABLE) ×1
HANDLE SUCTION POOLE (INSTRUMENTS) ×1 IMPLANT
KIT BASIN OR (CUSTOM PROCEDURE TRAY) ×1 IMPLANT
KIT TURNOVER KIT B (KITS) ×1 IMPLANT
LIGASURE IMPACT 36 18CM CVD LR (INSTRUMENTS) IMPLANT
NS IRRIG 1000ML POUR BTL (IV SOLUTION) ×2 IMPLANT
PACK GENERAL/GYN (CUSTOM PROCEDURE TRAY) ×1 IMPLANT
PAD ARMBOARD 7.5X6 YLW CONV (MISCELLANEOUS) ×1 IMPLANT
PENCIL SMOKE EVACUATOR (MISCELLANEOUS) ×1 IMPLANT
RELOAD PROXIMATE 75MM BLUE (ENDOMECHANICALS) ×2 IMPLANT
RELOAD STAPLE 75 3.8 BLU REG (ENDOMECHANICALS) IMPLANT
SPECIMEN JAR LARGE (MISCELLANEOUS) IMPLANT
SPONGE T-LAP 18X18 ~~LOC~~+RFID (SPONGE) IMPLANT
STAPLER GUN LINEAR PROX 60 (STAPLE) IMPLANT
STAPLER PROXIMATE 75MM BLUE (STAPLE) IMPLANT
STAPLER VISISTAT 35W (STAPLE) ×1 IMPLANT
SUCTION POOLE HANDLE (INSTRUMENTS) ×1
SUT PDS AB 1 TP1 96 (SUTURE) ×2 IMPLANT
SUT SILK 2 0 SH CR/8 (SUTURE) ×1 IMPLANT
SUT SILK 2 0 TIES 10X30 (SUTURE) ×1 IMPLANT
SUT SILK 3 0 SH CR/8 (SUTURE) ×1 IMPLANT
SUT SILK 3 0 TIES 10X30 (SUTURE) ×1 IMPLANT
SUT VIC AB 3-0 SH 18 (SUTURE) IMPLANT
TOWEL GREEN STERILE (TOWEL DISPOSABLE) ×1 IMPLANT
TRAY FOLEY MTR SLVR 16FR STAT (SET/KITS/TRAYS/PACK) ×1 IMPLANT
YANKAUER SUCT BULB TIP NO VENT (SUCTIONS) IMPLANT
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
## 2022-10-06 NOTE — ED Notes (Signed)
Patient maintaining on 4L [place].

## 2022-10-06 NOTE — ED Provider Notes (Incomplete)
  [facility] EMERGENCY DEPARTMENT Provider Note   CSN: [id number] Arrival date & time: 10/06/22  2036     History {Add pertinent medical, surgical, social history, OB history to HPI:1} No chief complaint on file.   Timothy Black is a 37 y.o. male.  HPI     Home Medications Prior to Admission medications   Not on File      Allergies    Patient has no allergy information on record.    Review of Systems   Review of Systems  Physical Exam Updated Vital Signs BP 131/71   Pulse 90   Temp (!) 96 F (35.6 C) (Oral)   Resp (!) 31   SpO2 100%  Physical Exam  ED Results / Procedures / Treatments   Labs (all labs ordered are listed, but only abnormal results are displayed) Labs Reviewed  I-STAT CHEM 8, ED - Abnormal; Notable for the following components:      Result Value   Glucose, Bld 179 (*)    Calcium, Ion 0.95 (*)    Hemoglobin 11.9 (*)    HCT 35.0 (*)    All other components within normal limits  COMPREHENSIVE METABOLIC PANEL  CBC  ETHANOL  URINALYSIS, ROUTINE W REFLEX MICROSCOPIC  LACTIC ACID, PLASMA  PROTIME-INR  TYPE AND SCREEN  ABO/RH    EKG None  Radiology DG Chest Port 1 View  Result Date: 10/06/2022 CLINICAL DATA:  Trauma. EXAM: PORTABLE CHEST 1 VIEW COMPARISON:  08/30/2020. FINDINGS: The heart size and mediastinal contours are within normal limits. There is hazy opacification of the right lung. The left lung is clear. No pneumothorax bilaterally. No acute osseous abnormality. IMPRESSION: Hazy opacification of the right lung suggesting layering pleural effusion with possible edema or infiltrate. Electronically Signed   By: Brett Fairy M.D.   On: 10/06/2022 20:58    Procedures Procedures  {Document cardiac monitor, telemetry assessment procedure when appropriate:1}  Medications Ordered in ED Medications  tranexamic acid (CYKLOKAPRON) IVPB 1,000 mg (has no administration in time range)  tranexamic acid (CYKLOKAPRON) 1,000 mg  in sodium chloride 0.9 % 500 mL infusion (has no administration in time range)  ceFAZolin (ANCEF) IVPB 2g/100 mL premix (has no administration in time range)  Tdap (BOOSTRIX) injection 0.5 mL (has no administration in time range)  fentaNYL (SUBLIMAZE) injection 100 mcg (has no administration in time range)  fentaNYL (SUBLIMAZE) 100 MCG/2ML injection (has no administration in time range)    ED Course/ Medical Decision Making/ A&P                           Medical Decision Making Amount and/or Complexity of Data Reviewed Labs: ordered. Radiology: ordered.  Risk Prescription drug management. Decision regarding hospitalization.   ***  {Document critical care time when appropriate:1} {Document review of labs and clinical decision tools ie heart score, Chads2Vasc2 etc:1}  {Document your independent review of radiology images, and any outside records:1} {Document your discussion with family members, caretakers, and with consultants:1} {Document social determinants of health affecting pt's care:1} {Document your decision making why or why not admission, treatments were needed:1} Final Clinical Impression(s) / ED Diagnoses Final diagnoses:  None    Rx / DC Orders ED Discharge Orders     None

## 2022-10-06 NOTE — ED Notes (Signed)
Chest tube placed on right side by TMD White and EDP Daubach
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
## 2022-10-06 NOTE — Anesthesia Procedure Notes (Signed)
Arterial Line Insertion Start/End1/03/2023 10:55 PM, 10/06/2022 11:00 PM Performed by: Murvin Natal, MD, anesthesiologist  Patient location: OR. Preanesthetic checklist: patient identified, IV checked, site marked, risks and benefits discussed, surgical consent, monitors and equipment checked, pre-op evaluation, timeout performed and anesthesia consent Left, radial was placed Catheter size: 20 G Hand hygiene performed , maximum sterile barriers used  and Seldinger technique used  Attempts: 1 Procedure performed using ultrasound guided technique. Ultrasound Notes:anatomy identified, needle tip was noted to be adjacent to the nerve/plexus identified and no ultrasound evidence of intravascular and/or intraneural injection Following insertion, dressing applied and Biopatch. Post procedure assessment: normal and unchanged  Patient tolerated the procedure well with no immediate complications.
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
## 2022-10-06 NOTE — Progress Notes (Signed)
Orthopedic Tech Progress Note Patient Details:  Timothy Black 07-Oct-1985 [id number]  Patient ID: [name], male   DOB: April 13, 1986, 37 y.o.   MRN: [id number] I attended trauma page. Demerius, [place] 10/06/2022, 9:24 PM

## 2022-10-06 NOTE — ED Notes (Signed)
Patient transported to CT 

## 2022-10-06 NOTE — Anesthesia Procedure Notes (Signed)
Procedure Name: Intubation Date/Time: 10/06/2022 10:46 PM  Performed by: Trinna Post., CRNAPre-anesthesia Checklist: Patient identified, Emergency Drugs available, Suction available, Patient being monitored and Timeout performed Patient Re-evaluated:Patient Re-evaluated prior to induction Oxygen Delivery Method: Circle system utilized Preoxygenation: Pre-oxygenation with 100% oxygen Induction Type: IV induction, Rapid sequence and Cricoid Pressure applied Laryngoscope Size: Mac and 4 Grade View: Grade I Tube type: Oral Tube size: 7.5 mm Number of attempts: 1 Airway Equipment and Method: Stylet Placement Confirmation: ETT inserted through vocal cords under direct vision, positive ETCO2 and breath sounds checked- equal and bilateral Secured at: 22 cm Tube secured with: Tape Dental Injury: Teeth and Oropharynx as per pre-operative assessment

## 2022-10-07 ENCOUNTER — Other Ambulatory Visit (HOSPITAL_COMMUNITY): Payer: Self-pay | Admitting: Interventional Radiology

## 2022-10-07 ENCOUNTER — Inpatient Hospital Stay (HOSPITAL_COMMUNITY): Payer: Medicaid Other

## 2022-10-07 ENCOUNTER — Other Ambulatory Visit: Payer: Self-pay

## 2022-10-07 ENCOUNTER — Encounter (HOSPITAL_COMMUNITY): Payer: Self-pay | Admitting: Surgery

## 2022-10-07 DIAGNOSIS — W3400XA Accidental discharge from unspecified firearms or gun, initial encounter: Principal | ICD-10-CM

## 2022-10-07 LAB — CBC
HCT: 32.2 % — ABNORMAL LOW (ref 39.0–52.0)
Hemoglobin: 11.4 g/dL — ABNORMAL LOW (ref 13.0–17.0)
MCH: 30.8 pg (ref 26.0–34.0)
MCHC: 35.4 g/dL (ref 30.0–36.0)
MCV: 87 fL (ref 80.0–100.0)
Platelets: 211 10*3/uL (ref 150–400)
RBC: 3.7 MIL/uL — ABNORMAL LOW (ref 4.22–5.81)
RDW: 13.6 % (ref 11.5–15.5)
WBC: 29.8 10*3/uL — ABNORMAL HIGH (ref 4.0–10.5)
nRBC: 0 % (ref 0.0–0.2)

## 2022-10-07 LAB — POCT I-STAT 7, (LYTES, BLD GAS, ICA,H+H)
Acid-base deficit: 7 mmol/L — ABNORMAL HIGH (ref 0.0–2.0)
Bicarbonate: 19.7 mmol/L — ABNORMAL LOW (ref 20.0–28.0)
Calcium, Ion: 1.02 mmol/L — ABNORMAL LOW (ref 1.15–1.40)
HCT: 30 % — ABNORMAL LOW (ref 39.0–52.0)
Hemoglobin: 10.2 g/dL — ABNORMAL LOW (ref 13.0–17.0)
O2 Saturation: 100 %
Potassium: 4.8 mmol/L (ref 3.5–5.1)
Sodium: 140 mmol/L (ref 135–145)
TCO2: 21 mmol/L — ABNORMAL LOW (ref 22–32)
pCO2 arterial: 43.2 mmHg (ref 32–48)
pH, Arterial: 7.266 — ABNORMAL LOW (ref 7.35–7.45)
pO2, Arterial: 336 mmHg — ABNORMAL HIGH (ref 83–108)

## 2022-10-07 LAB — COMPREHENSIVE METABOLIC PANEL
ALT: 115 U/L — ABNORMAL HIGH (ref 0–44)
AST: 130 U/L — ABNORMAL HIGH (ref 15–41)
Albumin: 3 g/dL — ABNORMAL LOW (ref 3.5–5.0)
Alkaline Phosphatase: 44 U/L (ref 38–126)
Anion gap: 6 (ref 5–15)
BUN: 12 mg/dL (ref 6–20)
CO2: 25 mmol/L (ref 22–32)
Calcium: 7.7 mg/dL — ABNORMAL LOW (ref 8.9–10.3)
Chloride: 105 mmol/L (ref 98–111)
Creatinine, Ser: 0.86 mg/dL (ref 0.61–1.24)
GFR, Estimated: >60 mL/min (ref >60)
Glucose, Bld: 148 mg/dL — ABNORMAL HIGH (ref 70–99)
Potassium: 4.6 mmol/L (ref 3.5–5.1)
Sodium: 136 mmol/L (ref 135–145)
Total Bilirubin: 1.3 mg/dL — ABNORMAL HIGH (ref 0.3–1.2)
Total Protein: 5.2 g/dL — ABNORMAL LOW (ref 6.5–8.1)

## 2022-10-07 LAB — MRSA NEXT GEN BY PCR, NASAL: MRSA by PCR Next Gen: NOT DETECTED

## 2022-10-07 LAB — BLOOD PRODUCT ORDER (VERBAL) VERIFICATION

## 2022-10-07 LAB — HIV ANTIBODY (ROUTINE TESTING W REFLEX): HIV Screen 4th Generation wRfx: NONREACTIVE

## 2022-10-07 MED ORDER — BISACODYL 10 MG RE SUPP
10.0000 mg | Freq: Every day | RECTAL | Status: DC
  Administered 2022-10-07: 10 mg via RECTAL
  Filled 2022-10-07: qty 1

## 2022-10-07 MED ORDER — LACTATED RINGERS IV SOLN: INTRAVENOUS | Status: DC

## 2022-10-07 MED ORDER — PROPOFOL 1000 MG/100ML IV EMUL
5.0000 ug/kg/min | INTRAVENOUS | Status: DC
  Administered 2022-10-07: 70 ug/kg/min via INTRAVENOUS
  Administered 2022-10-07: 50 ug/kg/min via INTRAVENOUS
  Administered 2022-10-07 – 2022-10-08 (×4): 70 ug/kg/min via INTRAVENOUS
  Filled 2022-10-07 (×6): qty 100

## 2022-10-07 MED ORDER — PROPOFOL 500 MG/50ML IV EMUL
INTRAVENOUS | Status: DC | PRN
  Administered 2022-10-07: 50 ug/kg/min via INTRAVENOUS

## 2022-10-07 MED ORDER — CHLORHEXIDINE GLUCONATE CLOTH 2 % EX PADS
6.0000 | MEDICATED_PAD | Freq: Every day | CUTANEOUS | Status: DC
  Administered 2022-10-07 – 2022-11-04 (×27): 6 via TOPICAL

## 2022-10-07 MED ORDER — PANTOPRAZOLE SODIUM 40 MG IV SOLR
40.0000 mg | INTRAVENOUS | Status: DC
  Administered 2022-10-07 – 2022-10-09 (×3): 40 mg via INTRAVENOUS
  Filled 2022-10-07 (×3): qty 10

## 2022-10-07 MED ORDER — PROPOFOL 1000 MG/100ML IV EMUL
0.0000 ug/kg/min | INTRAVENOUS | Status: DC
  Administered 2022-10-07 (×3): 50 ug/kg/min via INTRAVENOUS
  Filled 2022-10-07 (×3): qty 100

## 2022-10-07 MED ORDER — ORAL CARE MOUTH RINSE: 15.0000 mL | OROMUCOSAL | Status: DC | PRN

## 2022-10-07 MED ORDER — ALBUMIN HUMAN 5 % IV SOLN: INTRAVENOUS | Status: DC | PRN

## 2022-10-07 MED ORDER — MIDAZOLAM-SODIUM CHLORIDE 100-0.9 MG/100ML-% IV SOLN
0.5000 mg/h | INTRAVENOUS | Status: DC
  Administered 2022-10-07: 0.5 mg/h via INTRAVENOUS
  Filled 2022-10-07: qty 100

## 2022-10-07 MED ORDER — 0.9 % SODIUM CHLORIDE (POUR BTL) OPTIME
TOPICAL | Status: DC | PRN
  Administered 2022-10-07: 2000 mL

## 2022-10-07 MED ORDER — POLYETHYLENE GLYCOL 3350 17 G PO PACK: 17.0000 g | PACK | Freq: Two times a day (BID) | ORAL | Status: DC

## 2022-10-07 MED ORDER — FENTANYL BOLUS VIA INFUSION
50.0000 ug | INTRAVENOUS | Status: DC | PRN
  Administered 2022-10-07 (×2): 50 ug via INTRAVENOUS
  Administered 2022-10-08: 100 ug via INTRAVENOUS

## 2022-10-07 MED ORDER — ORAL CARE MOUTH RINSE
15.0000 mL | OROMUCOSAL | Status: DC
  Administered 2022-10-07 – 2022-10-08 (×16): 15 mL via OROMUCOSAL

## 2022-10-07 MED ORDER — FENTANYL 2500MCG IN NS 250ML (10MCG/ML) PREMIX INFUSION
0.0000 ug/h | INTRAVENOUS | Status: DC
  Administered 2022-10-07: 225 ug/h via INTRAVENOUS
  Administered 2022-10-07: 50 ug/h via INTRAVENOUS
  Administered 2022-10-07 – 2022-10-08 (×2): 275 ug/h via INTRAVENOUS
  Filled 2022-10-07 (×4): qty 250

## 2022-10-07 MED ORDER — ACETAMINOPHEN 10 MG/ML IV SOLN
1000.0000 mg | Freq: Four times a day (QID) | INTRAVENOUS | Status: AC
  Administered 2022-10-07 – 2022-10-08 (×4): 1000 mg via INTRAVENOUS
  Filled 2022-10-07 (×4): qty 100

## 2022-10-07 MED ORDER — DEXMEDETOMIDINE HCL IN NACL 400 MCG/100ML IV SOLN
0.0000 ug/kg/h | INTRAVENOUS | Status: AC
  Administered 2022-10-07 – 2022-10-08 (×2): 0.4 ug/kg/h via INTRAVENOUS
  Administered 2022-10-09 (×2): 0.702 ug/kg/h via INTRAVENOUS
  Administered 2022-10-09: 0.8 ug/kg/h via INTRAVENOUS
  Administered 2022-10-09: 0.7 ug/kg/h via INTRAVENOUS
  Administered 2022-10-10: 1.2 ug/kg/h via INTRAVENOUS
  Administered 2022-10-10 (×2): 0.8 ug/kg/h via INTRAVENOUS
  Administered 2022-10-10: 1.2 ug/kg/h via INTRAVENOUS
  Filled 2022-10-07 (×8): qty 100

## 2022-10-07 MED ORDER — ONDANSETRON 4 MG PO TBDP: 4.0000 mg | ORAL_TABLET | Freq: Four times a day (QID) | ORAL | Status: DC | PRN

## 2022-10-07 MED ORDER — HYDRALAZINE HCL 20 MG/ML IJ SOLN
10.0000 mg | INTRAMUSCULAR | Status: DC | PRN
  Administered 2022-10-07 (×2): 10 mg via INTRAVENOUS
  Filled 2022-10-07 (×2): qty 1

## 2022-10-07 MED ORDER — ALBUMIN HUMAN 25 % IV SOLN
12.5000 g | Freq: Once | INTRAVENOUS | Status: AC
  Administered 2022-10-07: 12.5 g via INTRAVENOUS
  Filled 2022-10-07: qty 50

## 2022-10-07 MED ORDER — DOCUSATE SODIUM 50 MG/5ML PO LIQD: 100.0000 mg | Freq: Two times a day (BID) | ORAL | Status: DC

## 2022-10-07 MED ORDER — FENTANYL CITRATE PF 50 MCG/ML IJ SOSY
50.0000 ug | PREFILLED_SYRINGE | Freq: Once | INTRAMUSCULAR | Status: AC
  Administered 2022-10-07: 50 ug via INTRAVENOUS

## 2022-10-07 MED ORDER — ONDANSETRON HCL 4 MG/2ML IJ SOLN
4.0000 mg | Freq: Four times a day (QID) | INTRAMUSCULAR | Status: DC | PRN
  Administered 2022-10-08 – 2022-11-04 (×2): 4 mg via INTRAVENOUS
  Filled 2022-10-07 (×2): qty 2
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
## 2022-10-07 NOTE — Plan of Care (Signed)
  Problem: Safety: Goal: Non-violent Restraint(s) Outcome: Not Progressing   Problem: Education: Goal: Knowledge of General Education information will improve Description: Including pain rating scale, medication(s)/side effects and non-pharmacologic comfort measures Outcome: Not Progressing   Problem: Health Behavior/Discharge Planning: Goal: Ability to manage health-related needs will improve Outcome: Not Progressing   Problem: Clinical Measurements: Goal: Ability to maintain clinical measurements within normal limits will improve Outcome: Not Progressing Goal: Will remain free from infection Outcome: Not Progressing Goal: Diagnostic test results will improve Outcome: Not Progressing Goal: Respiratory complications will improve Outcome: Not Progressing Goal: Cardiovascular complication will be avoided Outcome: Not Progressing   Problem: Activity: Goal: Risk for activity intolerance will decrease Outcome: Not Progressing   Problem: Nutrition: Goal: Adequate nutrition will be maintained Outcome: Not Progressing   Problem: Coping: Goal: Level of anxiety will decrease Outcome: Not Progressing   Problem: Elimination: Goal: Will not experience complications related to bowel motility Outcome: Not Progressing Goal: Will not experience complications related to urinary retention Outcome: Not Progressing   Problem: Pain Managment: Goal: General experience of comfort will improve Outcome: Not Progressing   Problem: Safety: Goal: Ability to remain free from injury will improve Outcome: Not Progressing   Problem: Skin Integrity: Goal: Risk for impaired skin integrity will decrease Outcome: Not Progressing   Problem: Education: Goal: Required Educational Video(s) Outcome: Not Progressing   Problem: Clinical Measurements: Goal: Ability to maintain clinical measurements within normal limits will improve Outcome: Not Progressing Goal: Postoperative complications will be  avoided or minimized Outcome: Not Progressing   Problem: Skin Integrity: Goal: Demonstration of wound healing without infection will improve Outcome: Not Progressing
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
## 2022-10-07 NOTE — Transfer of Care (Signed)
Immediate Anesthesia Transfer of Care Note  Patient: Timothy Black  Procedure(s) Performed: EXPLORATORY LAPAROTOMY, PARTIAL COLECTOMY, REPAIR OF GASTRIC INJURY TIMES TWO, TAKE DOWN  SPLENIIC FLEXURE (Abdomen)  Patient Location: ICU  Anesthesia Type:General  Level of Consciousness: sedated and Patient remains intubated per anesthesia plan  Airway & Oxygen Therapy: Patient remains intubated per anesthesia plan and Patient placed on Ventilator (see vital sign flow sheet for setting)  Post-op Assessment: Report given to RN and Post -op Vital signs reviewed and stable  Post vital signs: Reviewed and stable  Last Vitals:  Vitals Value Taken Time  BP    Temp    Pulse 93 10/07/22 0130  Resp 18 10/07/22 0131  SpO2 98 % 10/07/22 0130  Vitals shown include unvalidated device data.  Last Pain:  Vitals:   10/06/22 2041  TempSrc: Oral  PainSc:          Complications: No notable events documented.
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
## 2022-10-08 ENCOUNTER — Inpatient Hospital Stay (HOSPITAL_COMMUNITY): Payer: Medicaid Other

## 2022-10-08 LAB — CBC
HCT: 24.9 % — ABNORMAL LOW (ref 39.0–52.0)
HCT: 23.9 % — ABNORMAL LOW (ref 39.0–52.0)
Hemoglobin: 8.4 g/dL — ABNORMAL LOW (ref 13.0–17.0)
Hemoglobin: 8.1 g/dL — ABNORMAL LOW (ref 13.0–17.0)
MCH: 30.1 pg (ref 26.0–34.0)
MCH: 30.1 pg (ref 26.0–34.0)
MCHC: 33.7 g/dL (ref 30.0–36.0)
MCHC: 33.9 g/dL (ref 30.0–36.0)
MCV: 89.2 fL (ref 80.0–100.0)
MCV: 88.8 fL (ref 80.0–100.0)
Platelets: 132 10*3/uL — ABNORMAL LOW (ref 150–400)
Platelets: 141 10*3/uL — ABNORMAL LOW (ref 150–400)
RBC: 2.79 MIL/uL — ABNORMAL LOW (ref 4.22–5.81)
RBC: 2.69 MIL/uL — ABNORMAL LOW (ref 4.22–5.81)
RDW: 13.8 % (ref 11.5–15.5)
RDW: 13.9 % (ref 11.5–15.5)
WBC: 15.7 10*3/uL — ABNORMAL HIGH (ref 4.0–10.5)
WBC: 18.2 10*3/uL — ABNORMAL HIGH (ref 4.0–10.5)
nRBC: 0 % (ref 0.0–0.2)
nRBC: 0 % (ref 0.0–0.2)

## 2022-10-08 LAB — BASIC METABOLIC PANEL
Anion gap: 3 — ABNORMAL LOW (ref 5–15)
BUN: 11 mg/dL (ref 6–20)
CO2: 28 mmol/L (ref 22–32)
Calcium: 7.5 mg/dL — ABNORMAL LOW (ref 8.9–10.3)
Chloride: 105 mmol/L (ref 98–111)
Creatinine, Ser: 0.71 mg/dL (ref 0.61–1.24)
GFR, Estimated: >60 mL/min (ref >60)
Glucose, Bld: 114 mg/dL — ABNORMAL HIGH (ref 70–99)
Potassium: 3.8 mmol/L (ref 3.5–5.1)
Sodium: 136 mmol/L (ref 135–145)

## 2022-10-08 LAB — TYPE AND SCREEN
ABO/RH(D): A POS
Antibody Screen: NEGATIVE
Unit division: 0
Unit division: 0

## 2022-10-08 LAB — SURGICAL PATHOLOGY

## 2022-10-08 LAB — BPAM RBC
Blood Product Expiration Date: 202401192359
Blood Product Expiration Date: 202401242359
ISSUE DATE / TIME: 202401072032
ISSUE DATE / TIME: 202401072039
Unit Type and Rh: 5100
Unit Type and Rh: 5100

## 2022-10-08 LAB — TRIGLYCERIDES: Triglycerides: 160 mg/dL — ABNORMAL HIGH (ref <150)

## 2022-10-08 MED ORDER — ACETAMINOPHEN 10 MG/ML IV SOLN
1000.0000 mg | Freq: Four times a day (QID) | INTRAVENOUS | Status: AC | PRN
  Administered 2022-10-08: 1000 mg via INTRAVENOUS
  Filled 2022-10-08 (×2): qty 100

## 2022-10-08 MED ORDER — ACETAMINOPHEN 325 MG PO TABS: 650.0000 mg | ORAL_TABLET | Freq: Four times a day (QID) | ORAL | Status: DC | PRN

## 2022-10-08 MED ORDER — HYDROMORPHONE 1 MG/ML IV SOLN
INTRAVENOUS | Status: DC
  Administered 2022-10-08: 30 mg via INTRAVENOUS
  Administered 2022-10-08: 3.9 mg via INTRAVENOUS
  Administered 2022-10-08: 4.73 mg via INTRAVENOUS
  Administered 2022-10-08: 4.2 mg via INTRAVENOUS
  Administered 2022-10-09: 2.9 mg via INTRAVENOUS
  Administered 2022-10-09: 30 mg via INTRAVENOUS
  Administered 2022-10-09: 4.5 mg via INTRAVENOUS
  Administered 2022-10-09 (×2): 3.9 mg via INTRAVENOUS
  Administered 2022-10-09: 3.6 mg via INTRAVENOUS
  Administered 2022-10-10: 4.2 mg via INTRAVENOUS
  Administered 2022-10-10 (×2): 3.9 mg via INTRAVENOUS
  Filled 2022-10-08 (×3): qty 30

## 2022-10-08 MED ORDER — HYDROMORPHONE HCL 1 MG/ML IJ SOLN
1.0000 mg | INTRAMUSCULAR | Status: DC | PRN
  Administered 2022-10-09 (×3): 1.5 mg via INTRAVENOUS
  Administered 2022-10-10: 1 mg via INTRAVENOUS
  Administered 2022-10-10 (×2): 1.5 mg via INTRAVENOUS
  Filled 2022-10-08: qty 2
  Filled 2022-10-08: qty 1
  Filled 2022-10-08 (×4): qty 2

## 2022-10-08 MED ORDER — NALOXONE HCL 0.4 MG/ML IJ SOLN: 0.4000 mg | INTRAMUSCULAR | Status: DC | PRN

## 2022-10-08 MED ORDER — DIPHENHYDRAMINE HCL 50 MG/ML IJ SOLN: 12.5000 mg | Freq: Four times a day (QID) | INTRAMUSCULAR | Status: DC | PRN

## 2022-10-08 MED ORDER — ORAL CARE MOUTH RINSE: 15.0000 mL | OROMUCOSAL | Status: DC | PRN

## 2022-10-08 MED ORDER — HYDROMORPHONE HCL 1 MG/ML IJ SOLN
1.0000 mg | INTRAMUSCULAR | Status: DC | PRN
  Administered 2022-10-08: 1 mg via INTRAVENOUS
  Filled 2022-10-08: qty 1

## 2022-10-08 MED ORDER — SODIUM CHLORIDE 0.9% FLUSH: 9.0000 mL | INTRAVENOUS | Status: DC | PRN

## 2022-10-08 MED ORDER — DIPHENHYDRAMINE HCL 12.5 MG/5ML PO ELIX: 12.5000 mg | ORAL_SOLUTION | Freq: Four times a day (QID) | ORAL | Status: DC | PRN

## 2022-10-08 MED ORDER — ACETAMINOPHEN 500 MG PO TABS: 1000.0000 mg | ORAL_TABLET | Freq: Four times a day (QID) | ORAL | Status: DC | PRN

## 2022-10-08 MED ORDER — HYDROMORPHONE HCL 1 MG/ML IJ SOLN
INTRAMUSCULAR | Status: AC
  Administered 2022-10-08: 1.5 mg via INTRAVENOUS
  Filled 2022-10-08: qty 2
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
## 2022-10-08 NOTE — Progress Notes (Signed)
Patient noted to have pulled out NGT.  When asked how it came out patient stated it fell out and stated he was having trouble breathing and he pulled it out.  MD paged.

## 2022-10-08 NOTE — Procedures (Signed)
Extubation Procedure Note  Patient Details:   Name: Timothy Black DOB: [birth year] MRN: [id number]   Airway Documentation:    Vent end date: 10/08/22 Vent end time: 0900   Evaluation  O2 sats: stable throughout Complications: No apparent complications Patient did tolerate procedure well. Bilateral Breath Sounds: Clear   Yes  Positive cuff leak noted. Patient placed on [place] 4L with humidity, no stridor noted. Patient able to reach 625 mL using the incentive spirometer.  Mingo Amber Onia Shiflett 10/08/2022, 10:29 AM

## 2022-10-08 NOTE — Progress Notes (Signed)
MD stated to leave NGT out at this time but to continue to monitor.

## 2022-10-09 ENCOUNTER — Inpatient Hospital Stay (HOSPITAL_COMMUNITY): Payer: Medicaid Other

## 2022-10-09 LAB — URINALYSIS, ROUTINE W REFLEX MICROSCOPIC
Bacteria, UA: NONE SEEN
Bilirubin Urine: NEGATIVE
Glucose, UA: NEGATIVE mg/dL
Ketones, ur: 20 mg/dL — AB
Leukocytes,Ua: NEGATIVE
Nitrite: NEGATIVE
Protein, ur: 30 mg/dL — AB
RBC / HPF: >50 RBC/hpf — ABNORMAL HIGH (ref 0–5)
Specific Gravity, Urine: 1.023 (ref 1.005–1.030)
pH: 6 (ref 5.0–8.0)

## 2022-10-09 LAB — CBC
HCT: 21.3 % — ABNORMAL LOW (ref 39.0–52.0)
Hemoglobin: 7.3 g/dL — ABNORMAL LOW (ref 13.0–17.0)
MCH: 30.2 pg (ref 26.0–34.0)
MCHC: 34.3 g/dL (ref 30.0–36.0)
MCV: 88 fL (ref 80.0–100.0)
Platelets: 126 10*3/uL — ABNORMAL LOW (ref 150–400)
RBC: 2.42 MIL/uL — ABNORMAL LOW (ref 4.22–5.81)
RDW: 13.4 % (ref 11.5–15.5)
WBC: 14.4 10*3/uL — ABNORMAL HIGH (ref 4.0–10.5)
nRBC: 0 % (ref 0.0–0.2)

## 2022-10-09 LAB — BASIC METABOLIC PANEL
Anion gap: 4 — ABNORMAL LOW (ref 5–15)
BUN: 16 mg/dL (ref 6–20)
CO2: 26 mmol/L (ref 22–32)
Calcium: 7.8 mg/dL — ABNORMAL LOW (ref 8.9–10.3)
Chloride: 106 mmol/L (ref 98–111)
Creatinine, Ser: 0.71 mg/dL (ref 0.61–1.24)
GFR, Estimated: >60 mL/min (ref >60)
Glucose, Bld: 97 mg/dL (ref 70–99)
Potassium: 3.5 mmol/L (ref 3.5–5.1)
Sodium: 136 mmol/L (ref 135–145)
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
## 2022-10-10 ENCOUNTER — Inpatient Hospital Stay (HOSPITAL_COMMUNITY): Payer: Medicaid Other

## 2022-10-10 LAB — BASIC METABOLIC PANEL
Anion gap: 9 (ref 5–15)
BUN: 16 mg/dL (ref 6–20)
CO2: 21 mmol/L — ABNORMAL LOW (ref 22–32)
Calcium: 7.4 mg/dL — ABNORMAL LOW (ref 8.9–10.3)
Chloride: 106 mmol/L (ref 98–111)
Creatinine, Ser: 0.7 mg/dL (ref 0.61–1.24)
GFR, Estimated: >60 mL/min (ref >60)
Glucose, Bld: 98 mg/dL (ref 70–99)
Potassium: 3.3 mmol/L — ABNORMAL LOW (ref 3.5–5.1)
Sodium: 136 mmol/L (ref 135–145)

## 2022-10-10 LAB — POCT I-STAT 7, (LYTES, BLD GAS, ICA,H+H)
Acid-base deficit: 4 mmol/L — ABNORMAL HIGH (ref 0.0–2.0)
Bicarbonate: 20.1 mmol/L (ref 20.0–28.0)
Calcium, Ion: 1.15 mmol/L (ref 1.15–1.40)
HCT: 21 % — ABNORMAL LOW (ref 39.0–52.0)
Hemoglobin: 7.1 g/dL — ABNORMAL LOW (ref 13.0–17.0)
O2 Saturation: 100 %
Potassium: 4.2 mmol/L (ref 3.5–5.1)
Sodium: 139 mmol/L (ref 135–145)
TCO2: 21 mmol/L — ABNORMAL LOW (ref 22–32)
pCO2 arterial: 29.7 mmHg — ABNORMAL LOW (ref 32–48)
pH, Arterial: 7.438 (ref 7.35–7.45)
pO2, Arterial: 188 mmHg — ABNORMAL HIGH (ref 83–108)

## 2022-10-10 LAB — CBC
HCT: 23.8 % — ABNORMAL LOW (ref 39.0–52.0)
Hemoglobin: 8.1 g/dL — ABNORMAL LOW (ref 13.0–17.0)
MCH: 30.3 pg (ref 26.0–34.0)
MCHC: 34 g/dL (ref 30.0–36.0)
MCV: 89.1 fL (ref 80.0–100.0)
Platelets: 121 10*3/uL — ABNORMAL LOW (ref 150–400)
RBC: 2.67 MIL/uL — ABNORMAL LOW (ref 4.22–5.81)
RDW: 13.3 % (ref 11.5–15.5)
WBC: 10.9 10*3/uL — ABNORMAL HIGH (ref 4.0–10.5)
nRBC: 0 % (ref 0.0–0.2)

## 2022-10-10 MED ORDER — ENOXAPARIN SODIUM 30 MG/0.3ML IJ SOSY
30.0000 mg | PREFILLED_SYRINGE | Freq: Two times a day (BID) | INTRAMUSCULAR | Status: DC
  Administered 2022-10-10 – 2022-11-05 (×52): 30 mg via SUBCUTANEOUS
  Filled 2022-10-10 (×53): qty 0.3

## 2022-10-10 MED ORDER — HYDROMORPHONE HCL 1 MG/ML IJ SOLN
1.0000 mg | INTRAMUSCULAR | Status: DC | PRN
  Administered 2022-10-11 – 2022-10-13 (×4): 1 mg via INTRAVENOUS
  Filled 2022-10-10 (×5): qty 1

## 2022-10-10 MED ORDER — ETOMIDATE 2 MG/ML IV SOLN
INTRAVENOUS | Status: AC
  Administered 2022-10-10: 20 mg
  Filled 2022-10-10: qty 20

## 2022-10-10 MED ORDER — ROCURONIUM BROMIDE 10 MG/ML (PF) SYRINGE
PREFILLED_SYRINGE | INTRAVENOUS | Status: AC
  Administered 2022-10-10: 100 mg
  Filled 2022-10-10: qty 10

## 2022-10-10 MED ORDER — METHOCARBAMOL 500 MG PO TABS
1000.0000 mg | ORAL_TABLET | Freq: Three times a day (TID) | ORAL | Status: DC
  Administered 2022-10-10 – 2022-11-05 (×74): 1000 mg
  Filled 2022-10-10 (×73): qty 2

## 2022-10-10 MED ORDER — ACETAMINOPHEN 500 MG PO TABS
1000.0000 mg | ORAL_TABLET | Freq: Four times a day (QID) | ORAL | Status: DC
  Administered 2022-10-10 – 2022-10-26 (×57): 1000 mg
  Filled 2022-10-10 (×58): qty 2

## 2022-10-10 MED ORDER — DEXMEDETOMIDINE HCL IN NACL 400 MCG/100ML IV SOLN
INTRAVENOUS | Status: AC
  Filled 2022-10-10: qty 100

## 2022-10-10 MED ORDER — SUCCINYLCHOLINE CHLORIDE 200 MG/10ML IV SOSY
PREFILLED_SYRINGE | INTRAVENOUS | Status: AC
  Filled 2022-10-10: qty 10

## 2022-10-10 MED ORDER — KETOROLAC TROMETHAMINE 15 MG/ML IJ SOLN
30.0000 mg | Freq: Four times a day (QID) | INTRAMUSCULAR | Status: AC
  Administered 2022-10-10 – 2022-10-15 (×18): 30 mg via INTRAVENOUS
  Filled 2022-10-10 (×19): qty 2

## 2022-10-10 MED ORDER — DEXMEDETOMIDINE HCL IN NACL 400 MCG/100ML IV SOLN
0.0000 ug/kg/h | INTRAVENOUS | Status: DC
  Administered 2022-10-10 – 2022-10-11 (×4): 1.2 ug/kg/h via INTRAVENOUS
  Filled 2022-10-10 (×3): qty 100

## 2022-10-10 MED ORDER — ACETAMINOPHEN 500 MG PO TABS
1000.0000 mg | ORAL_TABLET | Freq: Four times a day (QID) | ORAL | Status: DC
  Administered 2022-10-10: 1000 mg via ORAL
  Filled 2022-10-10 (×2): qty 2

## 2022-10-10 MED ORDER — POTASSIUM CHLORIDE 10 MEQ/100ML IV SOLN
10.0000 meq | INTRAVENOUS | Status: AC
  Administered 2022-10-10 (×3): 10 meq via INTRAVENOUS
  Filled 2022-10-10 (×2): qty 100

## 2022-10-10 MED ORDER — FENTANYL 2500MCG IN NS 250ML (10MCG/ML) PREMIX INFUSION
0.0000 ug/h | INTRAVENOUS | Status: DC
  Administered 2022-10-10: 150 ug/h via INTRAVENOUS
  Administered 2022-10-11: 225 ug/h via INTRAVENOUS
  Filled 2022-10-10 (×2): qty 250

## 2022-10-10 MED ORDER — POTASSIUM CHLORIDE 20 MEQ PO PACK
40.0000 meq | PACK | Freq: Once | ORAL | Status: AC
  Administered 2022-10-10: 40 meq via ORAL
  Filled 2022-10-10: qty 2

## 2022-10-10 MED ORDER — KETAMINE HCL 50 MG/5ML IJ SOSY
PREFILLED_SYRINGE | INTRAMUSCULAR | Status: AC
  Filled 2022-10-10: qty 10

## 2022-10-10 MED ORDER — OXYCODONE HCL 5 MG PO TABS
5.0000 mg | ORAL_TABLET | ORAL | Status: DC | PRN
  Administered 2022-10-10 – 2022-10-18 (×14): 10 mg
  Filled 2022-10-10 (×17): qty 2

## 2022-10-10 MED ORDER — POTASSIUM CHLORIDE 10 MEQ/100ML IV SOLN
10.0000 meq | INTRAVENOUS | Status: AC
  Administered 2022-10-10 (×3): 10 meq via INTRAVENOUS
  Filled 2022-10-10 (×4): qty 100

## 2022-10-10 MED ORDER — MIDAZOLAM HCL 2 MG/2ML IJ SOLN
INTRAMUSCULAR | Status: AC
  Filled 2022-10-10: qty 2

## 2022-10-10 MED ORDER — METHOCARBAMOL 500 MG PO TABS
1000.0000 mg | ORAL_TABLET | Freq: Three times a day (TID) | ORAL | Status: DC
  Filled 2022-10-10: qty 2

## 2022-10-10 MED ORDER — OXYCODONE HCL 5 MG PO TABS
5.0000 mg | ORAL_TABLET | ORAL | Status: DC | PRN
  Filled 2022-10-10: qty 2

## 2022-10-10 MED ORDER — PHENYLEPHRINE 80 MCG/ML (10ML) SYRINGE FOR IV PUSH (FOR BLOOD PRESSURE SUPPORT)
PREFILLED_SYRINGE | INTRAVENOUS | Status: AC
  Filled 2022-10-10: qty 10

## 2022-10-10 MED ORDER — FENTANYL CITRATE PF 50 MCG/ML IJ SOSY
PREFILLED_SYRINGE | INTRAMUSCULAR | Status: AC
  Administered 2022-10-10: 50 ug
  Filled 2022-10-10: qty 2
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
## 2022-10-10 NOTE — Procedures (Signed)
Intubation Procedure Note  Timathy Newberry  [id number]  06-01-1986  Date:10/10/22  Time:4:16 PM   Provider Performing: Jesusita Oka    Procedure: Intubation (31500)  Indication(s) Respiratory Failure  Consent Risks of the procedure as well as the alternatives and risks of each were explained to the patient and/or caregiver.  Consent for the procedure was obtained and is signed in the bedside chart   Anesthesia Etomidate and Rocuronium   Time Out Verified patient identification, verified procedure, site/side was marked, verified correct patient position, special equipment/implants available, medications/allergies/relevant history reviewed, required imaging and test results available.   Sterile Technique Usual hand hygeine, masks, and gloves were used   Procedure Description Patient positioned in bed supine.  Sedation given as noted above.  Patient was intubated with endotracheal tube using Glidescope.  View was Grade 1 full glottis .  Number of attempts was 1.  Colorimetric CO2 detector was consistent with tracheal placement.   Complications/Tolerance None; patient tolerated the procedure well. Chest X-ray is ordered to verify placement.   EBL none   Specimen(s) None

## 2022-10-10 NOTE — Progress Notes (Signed)
PT Cancellation Note  Patient Details Name: Timothy Black MRN: [id number] DOB: [birth year]   Cancelled Treatment:    Reason Eval/Treat Not Completed: Medical issues which prohibited therapy; noted recent intubation.  Will attempt another day.   Reginia Naas 10/10/2022, 4:35 PM Magda Kiel, PT Acute Rehabilitation Services [phone] 10/10/2022
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
## 2022-10-11 ENCOUNTER — Inpatient Hospital Stay (HOSPITAL_COMMUNITY): Payer: Medicaid Other

## 2022-10-11 LAB — BASIC METABOLIC PANEL
Anion gap: 5 (ref 5–15)
BUN: 24 mg/dL — ABNORMAL HIGH (ref 6–20)
CO2: 22 mmol/L (ref 22–32)
Calcium: 7.6 mg/dL — ABNORMAL LOW (ref 8.9–10.3)
Chloride: 110 mmol/L (ref 98–111)
Creatinine, Ser: 0.66 mg/dL (ref 0.61–1.24)
GFR, Estimated: >60 mL/min (ref >60)
Glucose, Bld: 96 mg/dL (ref 70–99)
Potassium: 4.1 mmol/L (ref 3.5–5.1)
Sodium: 137 mmol/L (ref 135–145)

## 2022-10-11 LAB — CBC
HCT: 19.2 % — ABNORMAL LOW (ref 39.0–52.0)
Hemoglobin: 6.5 g/dL — CL (ref 13.0–17.0)
MCH: 31.1 pg (ref 26.0–34.0)
MCHC: 33.9 g/dL (ref 30.0–36.0)
MCV: 91.9 fL (ref 80.0–100.0)
Platelets: 143 10*3/uL — ABNORMAL LOW (ref 150–400)
RBC: 2.09 MIL/uL — ABNORMAL LOW (ref 4.22–5.81)
RDW: 13.7 % (ref 11.5–15.5)
WBC: 11.3 10*3/uL — ABNORMAL HIGH (ref 4.0–10.5)
nRBC: 0 % (ref 0.0–0.2)

## 2022-10-11 LAB — GLUCOSE, CAPILLARY
Glucose-Capillary: 76 mg/dL (ref 70–99)
Glucose-Capillary: 89 mg/dL (ref 70–99)

## 2022-10-11 LAB — PREPARE RBC (CROSSMATCH)

## 2022-10-11 LAB — BLOOD PRODUCT ORDER (VERBAL) VERIFICATION

## 2022-10-11 MED ORDER — MIDAZOLAM HCL 2 MG/2ML IJ SOLN
INTRAMUSCULAR | Status: AC
  Administered 2022-10-11: 2 mg
  Filled 2022-10-11: qty 8

## 2022-10-11 MED ORDER — PIVOT 1.5 CAL PO LIQD
1000.0000 mL | ORAL | Status: DC
  Administered 2022-10-11 – 2022-10-22 (×14): 1000 mL
  Filled 2022-10-11 (×4): qty 1000

## 2022-10-11 MED ORDER — LIDOCAINE HCL (PF) 1 % IJ SOLN
INTRAMUSCULAR | Status: AC
  Filled 2022-10-11: qty 30

## 2022-10-11 MED ORDER — SODIUM CHLORIDE 0.9% IV SOLUTION: Freq: Once | INTRAVENOUS | Status: DC

## 2022-10-11 MED ORDER — DOCUSATE SODIUM 50 MG/5ML PO LIQD
100.0000 mg | Freq: Two times a day (BID) | ORAL | Status: DC
  Administered 2022-10-11 – 2022-10-28 (×13): 100 mg
  Filled 2022-10-11 (×20): qty 10

## 2022-10-11 MED ORDER — FENTANYL BOLUS VIA INFUSION
200.0000 ug | Freq: Once | INTRAVENOUS | Status: AC
  Administered 2022-10-11: 200 ug via INTRAVENOUS
  Filled 2022-10-11: qty 200

## 2022-10-11 MED ORDER — FENTANYL 2500MCG IN NS 250ML (10MCG/ML) PREMIX INFUSION
200.0000 ug/h | Freq: Once | INTRAVENOUS | Status: AC
  Administered 2022-10-11: 200 ug/h via INTRAVENOUS

## 2022-10-11 MED ORDER — PROPOFOL 1000 MG/100ML IV EMUL
5.0000 ug/kg/min | INTRAVENOUS | Status: DC
  Administered 2022-10-11: 30 ug/kg/min via INTRAVENOUS
  Administered 2022-10-11: 60 ug/kg/min via INTRAVENOUS
  Administered 2022-10-11: 20 ug/kg/min via INTRAVENOUS
  Administered 2022-10-12: 70 ug/kg/min via INTRAVENOUS
  Administered 2022-10-12: 60 ug/kg/min via INTRAVENOUS
  Administered 2022-10-12: 70 ug/kg/min via INTRAVENOUS
  Filled 2022-10-11 (×8): qty 100

## 2022-10-11 MED ORDER — FENTANYL BOLUS VIA INFUSION
50.0000 ug | INTRAVENOUS | Status: DC | PRN
  Administered 2022-10-11 – 2022-10-23 (×22): 100 ug via INTRAVENOUS
  Administered 2022-10-24: 50 ug via INTRAVENOUS

## 2022-10-11 MED ORDER — MIDAZOLAM HCL 2 MG/2ML IJ SOLN
2.0000 mg | Freq: Once | INTRAMUSCULAR | Status: AC
  Administered 2022-10-11: 2 mg via INTRAVENOUS

## 2022-10-11 MED ORDER — VITAL HIGH PROTEIN PO LIQD: 1000.0000 mL | ORAL | Status: DC

## 2022-10-11 MED ORDER — PROSOURCE TF20 ENFIT COMPATIBL EN LIQD: 60.0000 mL | Freq: Every day | ENTERAL | Status: DC

## 2022-10-11 MED ORDER — PANTOPRAZOLE SODIUM 40 MG IV SOLR
40.0000 mg | INTRAVENOUS | Status: DC
  Administered 2022-10-11 – 2022-10-28 (×19): 40 mg via INTRAVENOUS
  Filled 2022-10-11 (×18): qty 10

## 2022-10-11 MED ORDER — FENTANYL BOLUS VIA INFUSION
200.0000 ug | Freq: Once | INTRAVENOUS | Status: AC
  Administered 2022-10-11: 200 ug via INTRAVENOUS

## 2022-10-11 MED ORDER — ORAL CARE MOUTH RINSE
15.0000 mL | OROMUCOSAL | Status: DC
  Administered 2022-10-11 – 2022-10-27 (×185): 15 mL via OROMUCOSAL

## 2022-10-11 MED ORDER — FENTANYL 2500MCG IN NS 250ML (10MCG/ML) PREMIX INFUSION
50.0000 ug/h | INTRAVENOUS | Status: DC
  Administered 2022-10-11: 250 ug/h via INTRAVENOUS
  Administered 2022-10-11: 300 ug/h via INTRAVENOUS
  Administered 2022-10-12: 400 ug/h via INTRAVENOUS
  Administered 2022-10-12: 375 ug/h via INTRAVENOUS
  Administered 2022-10-12 – 2022-10-13 (×6): 400 ug/h via INTRAVENOUS
  Filled 2022-10-11 (×9): qty 250

## 2022-10-11 NOTE — Progress Notes (Signed)
Trauma Event Note    Reason for Call : - Assistance with large bore chest tube insertion  Initial Focused Assessment: - Pt on vent in obvious pain - VSS  Interventions: - Assisted Earnstine Regal, trauma PAs with placing 75F Chest tube on R side.  Pt had a total of 880mcg of fentanyl and 8mg  of versed for the procedure.  Chest tube was immediately attached to sahara and placed on -20 cm H2O suction.  Taped and secured.  Confirmed by XR.  Existing pigtail chest tube was redressed and continued on -40 cm H2O suction (was increased since 11am).  Plan of Care: - Pending CXR - Monitor output closely - Pain control  Last imported Vital Signs BP 115/75   Pulse 87   Temp 98.2 F (36.8 C) (Oral)   Resp (!) 22   Ht 5\' 11"  (1.803 m)   Wt 84.9 kg   SpO2 100%   BMI 26.11 kg/m   Trending CBC Recent Labs    10/09/22 0216 10/10/22 0323 10/10/22 1401 10/11/22 1247  WBC 14.4* 10.9*  --  11.3*  HGB 7.3* 8.1* 7.1* 6.5*  HCT 21.3* 23.8* 21.0* 19.2*  PLT 126* 121*  --  143*    Trending Coag's No results for input(s): "APTT", "INR" in the last 72 hours.  Trending BMET Recent Labs    10/09/22 0216 10/10/22 0323 10/10/22 1401 10/11/22 1247  NA 136 136 139 137  K 3.5 3.3* 4.2 4.1  CL 106 106  --  110  CO2 26 21*  --  22  BUN 16 16  --  24*  CREATININE 0.71 0.70  --  0.66  GLUCOSE 97 98  --  96    Timothy Black W  Trauma Response RN  Please call TRN at [phone] for further assistance.
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
## 2022-10-11 NOTE — Progress Notes (Signed)
Trauma PA in to place second chest tube 20 F in right chest above first Chest tube. Patient medicated see MAR and was awake during procedure tolerated well. When chest tube inserted it was placed to 20 of suction per PA. Follow up chest just completed.
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
## 2022-10-11 NOTE — Progress Notes (Signed)
PT Cancellation Note  Patient Details Name: Timothy Black MRN: [id number] DOB: 1986/01/17   Cancelled Treatment:    Reason Eval/Treat Not Completed: Medical issues which prohibited therapy.  Vented and bradycardic in the 30's. 10/11/2022  Timothy Carne., PT Acute Rehabilitation Services [phone]  (office)   Timothy Black 10/11/2022, 11:16 AM

## 2022-10-11 NOTE — Progress Notes (Signed)
OT Cancellation Note  Patient Details Name: Timothy Black MRN: [id number] DOB: 18-Apr-1986   Cancelled Treatment:    Reason Eval/Treat Not Completed: Medical issues which prohibited therapy (Pt intubated/sedated. Will check back a later time.)  [facility] 10/11/2022, 9:37 AM Maurie Boettcher, OT/L   Acute OT Clinical Specialist Acute Rehabilitation Services Pager [phone] Office [phone]

## 2022-10-11 NOTE — Procedures (Signed)
Insertion of Chest Tube Procedure Note  Kaius Daino  [id number]  1986-02-24  Date:10/11/22  Time:4:17 PM    Provider Performing: Jill Alexanders , PA-C  Procedure: Chest Tube Insertion (09323)  Indication(s) Pneumothorax  Consent Unable to obtain consent due to emergent nature of procedure.  Anesthesia Topical 1% lidocaine , 600 mcg fentanyl, 8 mg versed   Time Out Verified patient identification, verified procedure, site/side was marked, verified correct patient position, special equipment/implants available, medications/allergies/relevant history reviewed, required imaging and test results available.   Sterile Technique Maximal sterile technique including full sterile barrier drape, hand hygiene, sterile gown, sterile gloves, mask, hair covering, sterile ultrasound probe cover (if used).   Procedure Description Ultrasound not used to identify appropriate pleural anatomy for placement and overlying skin marked. Area of placement cleaned and draped in sterile fashion.  A 20 French chest tube was placed into the right pleural space using Kelly dissection. Appropriate return of air and blood was obtained.  The tube was connected to atrium and placed on -20 cm H2O wall suction.   Complications/Tolerance None; patient tolerated the procedure well. Chest X-ray is ordered to verify placement.   EBL Minimal  Specimen(s) none    Obie Dredge, PA-C [facility] Please see Amion for pager number during day hours 7:00am-4:30pm

## 2022-10-11 NOTE — Progress Notes (Addendum)
Date and time results received: 10/11/22 1330  Critical Value: Hgb 6.5  Name of Provider Notified: Dr. Bobbye Morton  Orders Received? Or Actions Taken?: Orders Received - See Orders for details and Actions Taken: Verbal to order 1 unit of PRBC's. Bedside RN updated.  Lennard Capek, Rande Brunt, RN

## 2022-10-11 NOTE — Progress Notes (Signed)
Pt had a 5sec pause in cardiac rhythm while I was in the room. Alert eyes open able to communicate with nodding his head that he didn't feel bad.

## 2022-10-12 ENCOUNTER — Inpatient Hospital Stay (HOSPITAL_COMMUNITY): Payer: Medicaid Other

## 2022-10-12 LAB — BASIC METABOLIC PANEL
Anion gap: 9 (ref 5–15)
BUN: 17 mg/dL (ref 6–20)
CO2: 21 mmol/L — ABNORMAL LOW (ref 22–32)
Calcium: 7.2 mg/dL — ABNORMAL LOW (ref 8.9–10.3)
Chloride: 109 mmol/L (ref 98–111)
Creatinine, Ser: 0.65 mg/dL (ref 0.61–1.24)
GFR, Estimated: >60 mL/min (ref >60)
Glucose, Bld: 103 mg/dL — ABNORMAL HIGH (ref 70–99)
Potassium: 3.1 mmol/L — ABNORMAL LOW (ref 3.5–5.1)
Sodium: 139 mmol/L (ref 135–145)

## 2022-10-12 LAB — CBC
HCT: 25.2 % — ABNORMAL LOW (ref 39.0–52.0)
Hemoglobin: 8.5 g/dL — ABNORMAL LOW (ref 13.0–17.0)
MCH: 30.6 pg (ref 26.0–34.0)
MCHC: 33.7 g/dL (ref 30.0–36.0)
MCV: 90.6 fL (ref 80.0–100.0)
Platelets: 200 10*3/uL (ref 150–400)
RBC: 2.78 MIL/uL — ABNORMAL LOW (ref 4.22–5.81)
RDW: 14.4 % (ref 11.5–15.5)
WBC: 11.9 10*3/uL — ABNORMAL HIGH (ref 4.0–10.5)
nRBC: 0.2 % (ref 0.0–0.2)

## 2022-10-12 LAB — BPAM RBC
Blood Product Expiration Date: 202402032359
ISSUE DATE / TIME: 202401121532
Unit Type and Rh: 6200

## 2022-10-12 LAB — TYPE AND SCREEN
ABO/RH(D): A POS
Antibody Screen: NEGATIVE
Unit division: 0

## 2022-10-12 LAB — GLUCOSE, CAPILLARY
Glucose-Capillary: 82 mg/dL (ref 70–99)
Glucose-Capillary: 85 mg/dL (ref 70–99)
Glucose-Capillary: 99 mg/dL (ref 70–99)
Glucose-Capillary: 101 mg/dL — ABNORMAL HIGH (ref 70–99)
Glucose-Capillary: 91 mg/dL (ref 70–99)

## 2022-10-12 LAB — MAGNESIUM: Magnesium: 1.8 mg/dL (ref 1.7–2.4)

## 2022-10-12 LAB — TRIGLYCERIDES: Triglycerides: 258 mg/dL — ABNORMAL HIGH (ref <150)

## 2022-10-12 MED ORDER — POTASSIUM CHLORIDE 20 MEQ PO PACK
40.0000 meq | PACK | Freq: Once | ORAL | Status: AC
  Administered 2022-10-12: 40 meq
  Filled 2022-10-12: qty 2

## 2022-10-12 MED ORDER — LORAZEPAM 2 MG/ML IJ SOLN
1.0000 mg | INTRAMUSCULAR | Status: DC | PRN
  Administered 2022-10-12 – 2022-10-16 (×8): 1 mg via INTRAVENOUS
  Filled 2022-10-12 (×9): qty 1

## 2022-10-12 MED ORDER — MAGNESIUM SULFATE 2 GM/50ML IV SOLN
2.0000 g | Freq: Once | INTRAVENOUS | Status: AC
  Administered 2022-10-12: 2 g via INTRAVENOUS
  Filled 2022-10-12: qty 50

## 2022-10-12 MED ORDER — FENTANYL 25 MCG/HR TD PT72
1.0000 | MEDICATED_PATCH | TRANSDERMAL | Status: DC
  Administered 2022-10-12 – 2022-10-21 (×4): 1 via TRANSDERMAL
  Filled 2022-10-12 (×6): qty 1

## 2022-10-12 MED ORDER — MIDAZOLAM-SODIUM CHLORIDE 100-0.9 MG/100ML-% IV SOLN
0.5000 mg/h | INTRAVENOUS | Status: AC
  Administered 2022-10-12: 10 mg/h via INTRAVENOUS
  Administered 2022-10-12: 0.5 mg/h via INTRAVENOUS
  Administered 2022-10-13 – 2022-10-14 (×4): 15 mg/h via INTRAVENOUS
  Administered 2022-10-14 (×2): 10 mg/h via INTRAVENOUS
  Administered 2022-10-14 – 2022-10-15 (×5): 15 mg/h via INTRAVENOUS
  Administered 2022-10-15: 11 mg/h via INTRAVENOUS
  Administered 2022-10-16 (×2): 15 mg/h via INTRAVENOUS
  Administered 2022-10-16: 10 mg/h via INTRAVENOUS
  Administered 2022-10-17 (×3): 15 mg/h via INTRAVENOUS
  Administered 2022-10-17: 14 mg/h via INTRAVENOUS
  Administered 2022-10-18 – 2022-10-20 (×11): 15 mg/h via INTRAVENOUS
  Administered 2022-10-21: 9.5 mg/h via INTRAVENOUS
  Administered 2022-10-21 – 2022-10-22 (×5): 15 mg/h via INTRAVENOUS
  Administered 2022-10-23: 10 mg/h via INTRAVENOUS
  Administered 2022-10-23 (×3): 15 mg/h via INTRAVENOUS
  Administered 2022-10-24: 10 mg/h via INTRAVENOUS
  Administered 2022-10-24 – 2022-10-25 (×2): 8 mg/h via INTRAVENOUS
  Filled 2022-10-12 (×33): qty 100
  Filled 2022-10-12: qty 200
  Filled 2022-10-12 (×8): qty 100

## 2022-10-12 MED ORDER — HALOPERIDOL LACTATE 5 MG/ML IJ SOLN
5.0000 mg | Freq: Four times a day (QID) | INTRAMUSCULAR | Status: DC | PRN
  Administered 2022-10-12 – 2022-10-13 (×2): 5 mg via INTRAVENOUS
  Filled 2022-10-12 (×2): qty 1
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
## 2022-10-12 NOTE — Progress Notes (Signed)
Pt agitation increasing despite prn ativan and versed gtt. Notified Dr. Kae Heller and new order place for prn Haldol.    Donalda Ewings RN

## 2022-10-12 NOTE — Progress Notes (Signed)
1mg  of ativan wasted with Zara Council, RN.
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
## 2022-10-12 NOTE — Progress Notes (Signed)
Notified by other nursing staff that patient had worked his way down in the bed and was attempting to pull his ETT out. Patient managed to pull his orogastric tube out. Unable to re-insert due to patient biting down. Placed mitts on patient. Patient then kicked his mitts off with is feet and attempted to get his feet up to his ETT. Placed patient in ankle restraints and notified Dr. Rosendo Gros. Orders for dose of ativan.

## 2022-10-13 ENCOUNTER — Inpatient Hospital Stay (HOSPITAL_COMMUNITY): Payer: Medicaid Other

## 2022-10-13 LAB — POCT I-STAT 7, (LYTES, BLD GAS, ICA,H+H)
Acid-base deficit: 2 mmol/L (ref 0.0–2.0)
Bicarbonate: 23.6 mmol/L (ref 20.0–28.0)
Calcium, Ion: 1.16 mmol/L (ref 1.15–1.40)
HCT: 26 % — ABNORMAL LOW (ref 39.0–52.0)
Hemoglobin: 8.8 g/dL — ABNORMAL LOW (ref 13.0–17.0)
O2 Saturation: 99 %
Potassium: 4.1 mmol/L (ref 3.5–5.1)
Sodium: 143 mmol/L (ref 135–145)
TCO2: 25 mmol/L (ref 22–32)
pCO2 arterial: 43.5 mmHg (ref 32–48)
pH, Arterial: 7.342 — ABNORMAL LOW (ref 7.35–7.45)
pO2, Arterial: 128 mmHg — ABNORMAL HIGH (ref 83–108)

## 2022-10-13 LAB — CBC
HCT: 25.9 % — ABNORMAL LOW (ref 39.0–52.0)
Hemoglobin: 8.7 g/dL — ABNORMAL LOW (ref 13.0–17.0)
MCH: 30.4 pg (ref 26.0–34.0)
MCHC: 33.6 g/dL (ref 30.0–36.0)
MCV: 90.6 fL (ref 80.0–100.0)
Platelets: 269 10*3/uL (ref 150–400)
RBC: 2.86 MIL/uL — ABNORMAL LOW (ref 4.22–5.81)
RDW: 14.7 % (ref 11.5–15.5)
WBC: 21.1 10*3/uL — ABNORMAL HIGH (ref 4.0–10.5)
nRBC: 0.1 % (ref 0.0–0.2)

## 2022-10-13 LAB — GLUCOSE, CAPILLARY
Glucose-Capillary: 126 mg/dL — ABNORMAL HIGH (ref 70–99)
Glucose-Capillary: 136 mg/dL — ABNORMAL HIGH (ref 70–99)
Glucose-Capillary: 68 mg/dL — ABNORMAL LOW (ref 70–99)
Glucose-Capillary: 71 mg/dL (ref 70–99)
Glucose-Capillary: 73 mg/dL (ref 70–99)
Glucose-Capillary: 84 mg/dL (ref 70–99)
Glucose-Capillary: 89 mg/dL (ref 70–99)
Glucose-Capillary: 99 mg/dL (ref 70–99)

## 2022-10-13 LAB — BASIC METABOLIC PANEL
Anion gap: 12 (ref 5–15)
BUN: 16 mg/dL (ref 6–20)
CO2: 23 mmol/L (ref 22–32)
Calcium: 7.6 mg/dL — ABNORMAL LOW (ref 8.9–10.3)
Chloride: 107 mmol/L (ref 98–111)
Creatinine, Ser: 0.73 mg/dL (ref 0.61–1.24)
GFR, Estimated: >60 mL/min (ref >60)
Glucose, Bld: 66 mg/dL — ABNORMAL LOW (ref 70–99)
Potassium: 3.9 mmol/L (ref 3.5–5.1)
Sodium: 142 mmol/L (ref 135–145)

## 2022-10-13 MED ORDER — HYDROMORPHONE HCL 1 MG/ML IJ SOLN
1.0000 mg | INTRAMUSCULAR | Status: DC | PRN
  Administered 2022-10-13 – 2022-10-30 (×51): 1 mg via INTRAVENOUS
  Filled 2022-10-13 (×53): qty 1

## 2022-10-13 MED ORDER — SODIUM CHLORIDE 0.9 % IV SOLN: INTRAVENOUS | Status: DC

## 2022-10-13 MED ORDER — DEXTROSE 50 % IV SOLN
INTRAVENOUS | Status: AC
  Administered 2022-10-13: 50 mL
  Filled 2022-10-13: qty 50

## 2022-10-13 MED ORDER — LIDOCAINE HCL (PF) 1 % IJ SOLN
INTRAMUSCULAR | Status: AC
  Filled 2022-10-13: qty 5

## 2022-10-13 MED ORDER — DEXTROSE 50 % IV SOLN
12.5000 g | Freq: Once | INTRAVENOUS | Status: AC
  Administered 2022-10-13: 12.5 g via INTRAVENOUS
  Filled 2022-10-13: qty 50

## 2022-10-13 MED ORDER — FENTANYL CITRATE (PF) 2500 MCG/50ML IJ SOLN
0.0000 ug/h | Status: DC
  Administered 2022-10-13 – 2022-10-14 (×2): 400 ug/h via INTRAVENOUS
  Administered 2022-10-14: 300 ug/h via INTRAVENOUS
  Administered 2022-10-14: 400 ug/h via INTRAVENOUS
  Administered 2022-10-15: 300 ug/h via INTRAVENOUS
  Administered 2022-10-15 – 2022-10-17 (×4): 400 ug/h via INTRAVENOUS
  Administered 2022-10-17: 350 ug/h via INTRAVENOUS
  Administered 2022-10-18 – 2022-10-20 (×6): 400 ug/h via INTRAVENOUS
  Administered 2022-10-21: 350 ug/h via INTRAVENOUS
  Administered 2022-10-21: 400 ug/h via INTRAVENOUS
  Administered 2022-10-22: 300 ug/h via INTRAVENOUS
  Administered 2022-10-22 – 2022-10-25 (×5): 350 ug/h via INTRAVENOUS
  Administered 2022-10-25: 175 ug/h via INTRAVENOUS
  Filled 2022-10-13 (×14): qty 100
  Filled 2022-10-13: qty 50
  Filled 2022-10-13 (×15): qty 100

## 2022-10-13 NOTE — Progress Notes (Signed)
Placed pt on Pressure Control due to increased Peak Airway Pressures (greater than 40, increased MV > 20). Pt VT matching on PC setting to what he was getting on PRVC. ABG Obtained. PaO2 still slightly low on 100%. Will leave and wean down as tolerated

## 2022-10-13 NOTE — Procedures (Signed)
Chest tube insertion  Date/Time: 10/13/2022 10:28 AM  Performed by: Ralene Ok, MD Authorized by: Ralene Ok, MD   Consent:    Consent obtained:  Emergent situation Pre-procedure details:    Skin preparation:  ChloraPrep Sedation:    Sedation type:  Moderate (conscious) sedation Procedure details:    Placement location:  R anterior   Tube size (Fr):  32   Technique: blunt     Dissection instrument:  Finger and Kelly clamp   Suture material:  0 silk   Dressing:  4x4 sterile gauze Post-procedure details:    Patient tolerance of procedure:  Tolerated well, no immediate complications Comments:     CXR pending
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
## 2022-10-13 NOTE — Progress Notes (Addendum)
After receiving bath  pt tachypnic with RR in in 40s and and HR 150s. Pt also began desatting in 27s. Placed pt on 100% Fio2 and made Dr. Kae Heller aware. Received new verbal order to increase. suction on large bore chest tube to -40 cm H2O.   Donalda Ewings RN

## 2022-10-14 ENCOUNTER — Inpatient Hospital Stay (HOSPITAL_COMMUNITY): Payer: Medicaid Other

## 2022-10-14 ENCOUNTER — Inpatient Hospital Stay: Payer: Self-pay

## 2022-10-14 LAB — BASIC METABOLIC PANEL
Anion gap: 3 — ABNORMAL LOW (ref 5–15)
BUN: 26 mg/dL — ABNORMAL HIGH (ref 6–20)
CO2: 27 mmol/L (ref 22–32)
Calcium: 7.1 mg/dL — ABNORMAL LOW (ref 8.9–10.3)
Chloride: 112 mmol/L — ABNORMAL HIGH (ref 98–111)
Creatinine, Ser: 0.62 mg/dL (ref 0.61–1.24)
GFR, Estimated: >60 mL/min (ref >60)
Glucose, Bld: 155 mg/dL — ABNORMAL HIGH (ref 70–99)
Potassium: 3.6 mmol/L (ref 3.5–5.1)
Sodium: 142 mmol/L (ref 135–145)

## 2022-10-14 LAB — GLUCOSE, CAPILLARY
Glucose-Capillary: 105 mg/dL — ABNORMAL HIGH (ref 70–99)
Glucose-Capillary: 121 mg/dL — ABNORMAL HIGH (ref 70–99)
Glucose-Capillary: 151 mg/dL — ABNORMAL HIGH (ref 70–99)
Glucose-Capillary: 151 mg/dL — ABNORMAL HIGH (ref 70–99)
Glucose-Capillary: 94 mg/dL (ref 70–99)
Glucose-Capillary: 99 mg/dL (ref 70–99)

## 2022-10-14 LAB — CBC
HCT: 23.8 % — ABNORMAL LOW (ref 39.0–52.0)
Hemoglobin: 7.6 g/dL — ABNORMAL LOW (ref 13.0–17.0)
MCH: 29.8 pg (ref 26.0–34.0)
MCHC: 31.9 g/dL (ref 30.0–36.0)
MCV: 93.3 fL (ref 80.0–100.0)
Platelets: 307 10*3/uL (ref 150–400)
RBC: 2.55 MIL/uL — ABNORMAL LOW (ref 4.22–5.81)
RDW: 14.8 % (ref 11.5–15.5)
WBC: 22.1 10*3/uL — ABNORMAL HIGH (ref 4.0–10.5)
nRBC: 0 % (ref 0.0–0.2)

## 2022-10-14 IMAGING — CT CT T SPINE W/O CM
2 of 3 series · 12 of 35 positions shown, 15 images · non-contrast
Comparison: None.

CLINICAL DATA: Spine fracture due to fall.

EXAM:
CT THORACIC AND LUMBAR SPINE WITHOUT CONTRAST
TECHNIQUE: Multidetector CT imaging of the thoracic and lumbar spine was
performed without contrast. Multiplanar CT image reconstructions
were also generated.

[Series 3: t spine soft · axial · 0.32mm/px · z∈[+1374,+1616]mm · 9 of 143 slices shown, 12 images]
[im 11/143  soft-tissue]
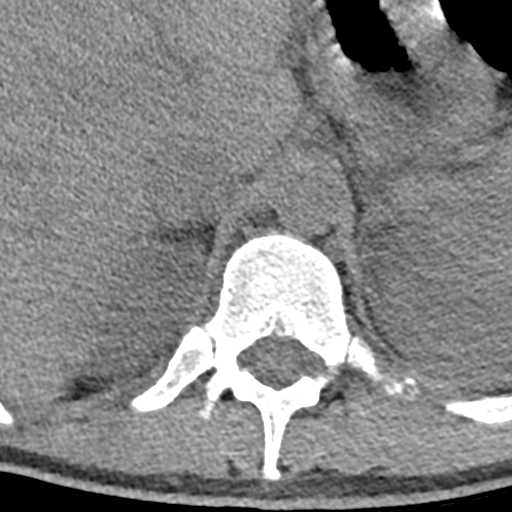
[im 11/143  bone]
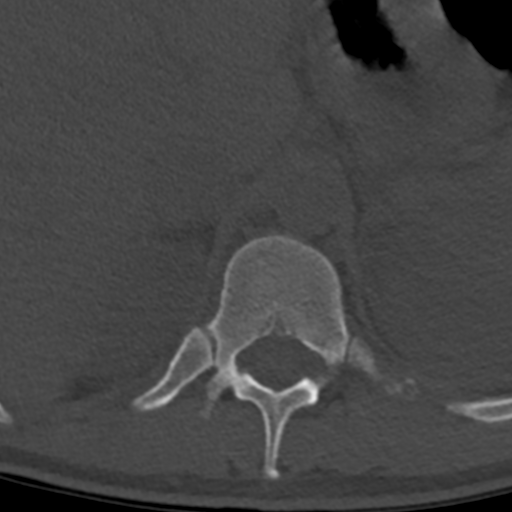
[im 33/143  bone]
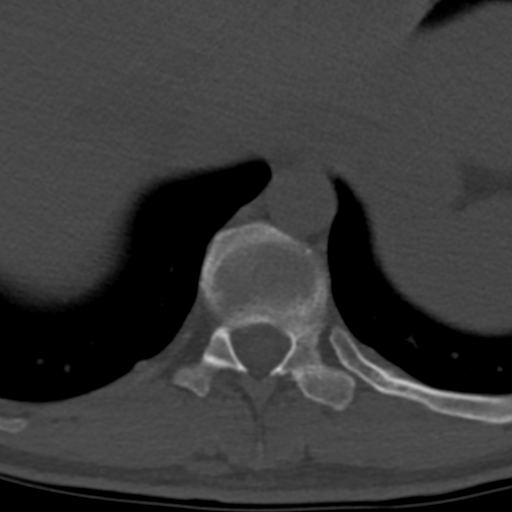
[im 44/143  bone]
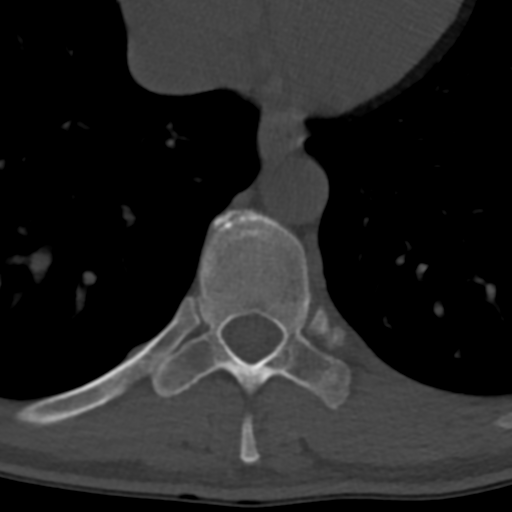
[im 55/143  bone]
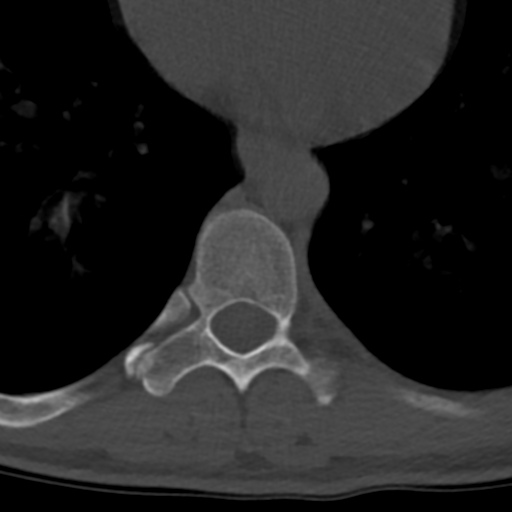
[im 77/143  soft-tissue]
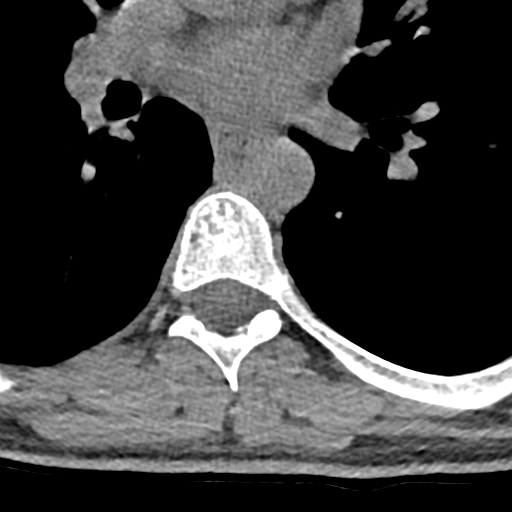
[im 77/143  bone]
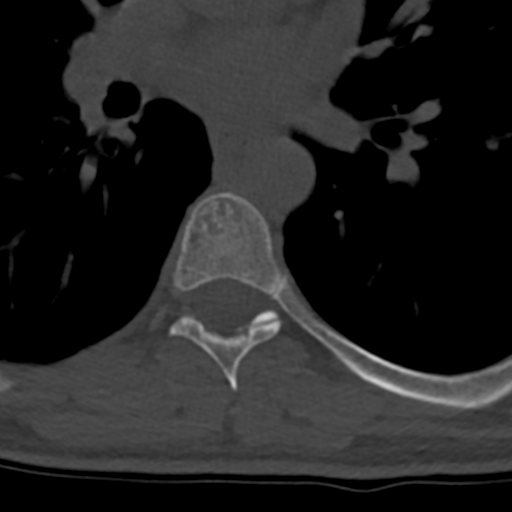
[im 88/143  bone]
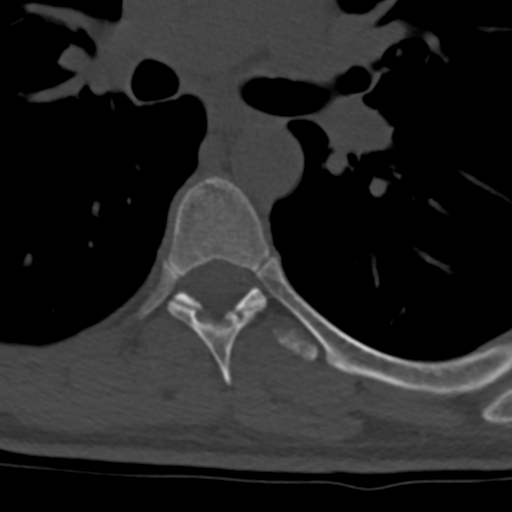
[im 99/143  bone]
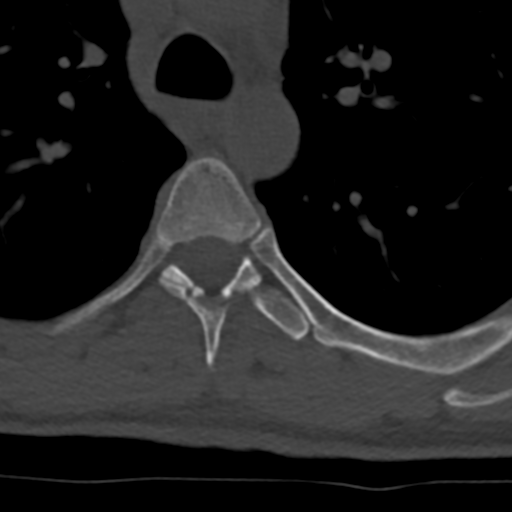
[im 121/143  bone]
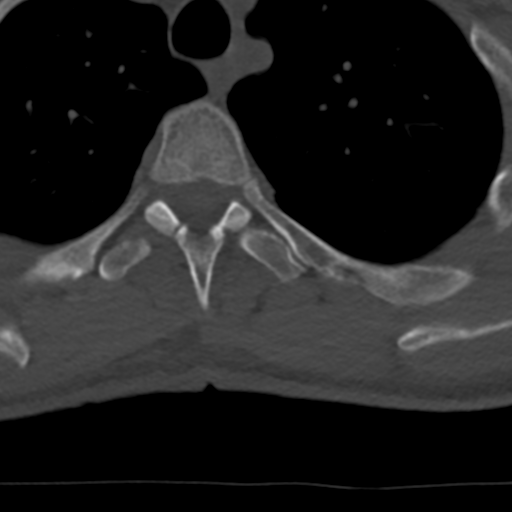
[im 132/143  soft-tissue]
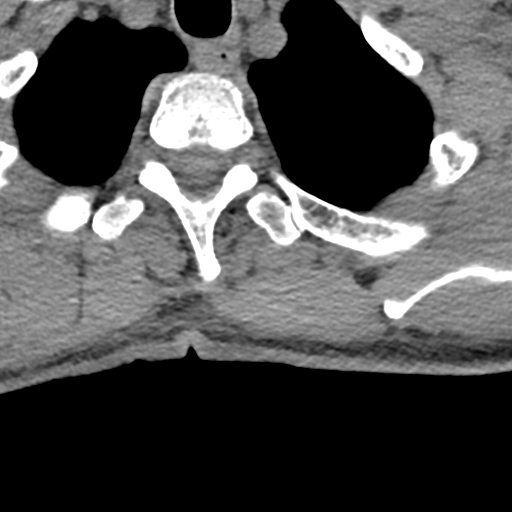
[im 132/143  bone]
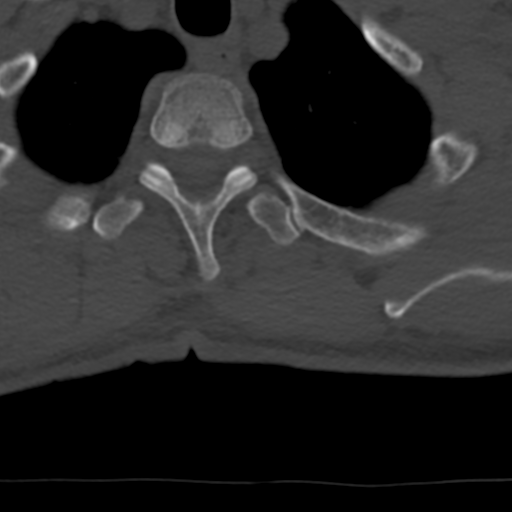

[Series 5: cor bone · coronal · 0.26mm/px · 3 of 69 slices shown]
[im 14/69  bone]
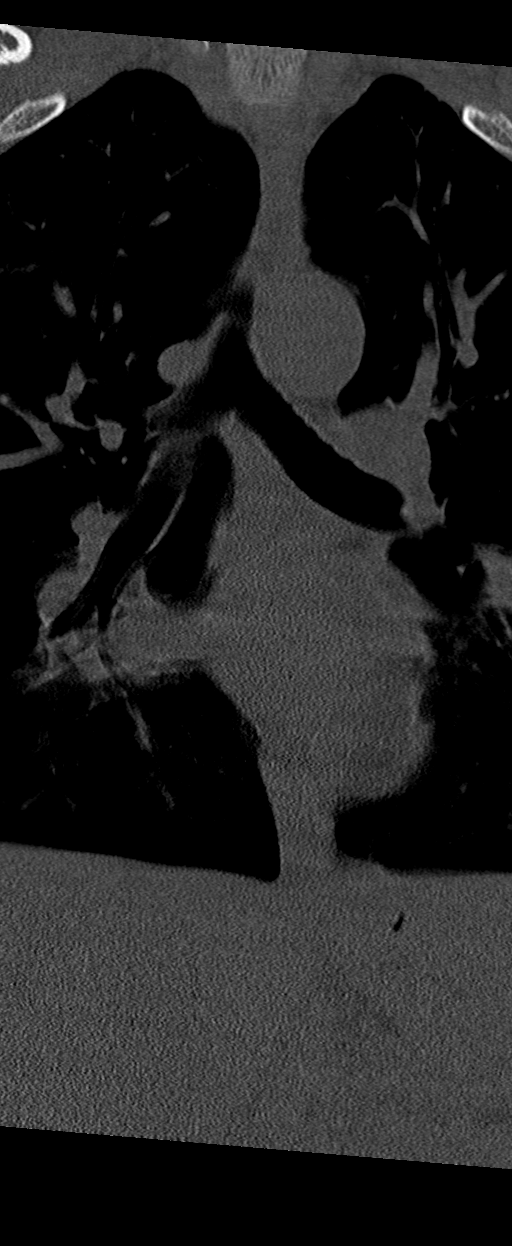
[im 28/69  bone]
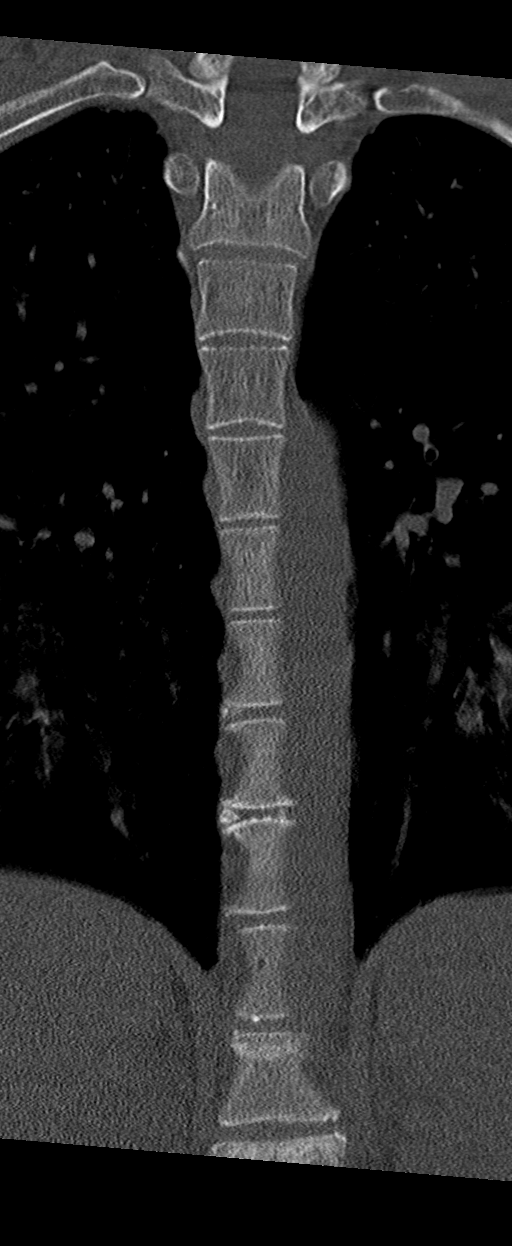
[im 41/69  bone]
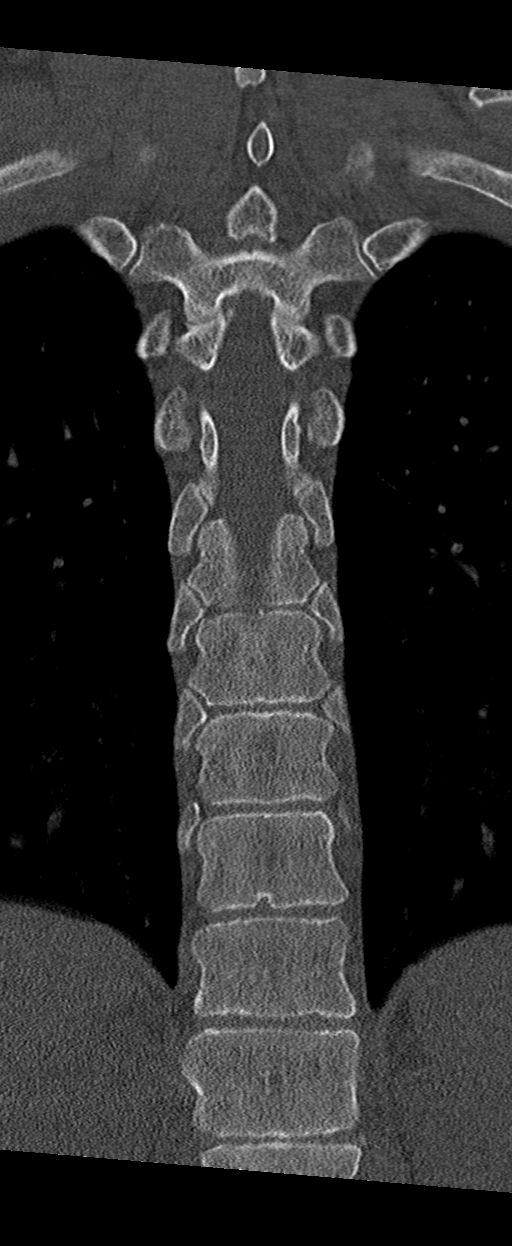

[12 of 35 positions shown; findings below may reference images not displayed]

FINDINGS: CT THORACIC SPINE FINDINGS

Alignment: Normal

Vertebrae: T12 compression fracture with horizontal fracture plane
that is high-density from trabecular impaction. Height loss is
approximately 15% when compared to T11. No retropulsion.

Left ninth, tenth, eleventh, and twelfth medial rib fractures with
callus. Left third and fourth rib head fractures with subtle callus.

Paraspinal and other soft tissues: No paravertebral edema is seen.

Disc levels: No visible herniation or cord impingement

CT LUMBAR SPINE FINDINGS

Segmentation: 5 lumbar type vertebrae

Alignment: Normal

Vertebrae: L1 compression fracture with horizontal sclerotic line
below the depressed superior endplate. Height loss is mild. No
retropulsion.

No evidence of erosion or bone lesion

Paraspinal and other soft tissues: No paraspinal hematoma

Disc levels: No visible herniation or impingement
IMPRESSION: 1. T12 and L1 compression fractures with mild height loss. The
fractures are likely subacute.
2. Healing left third, fourth, ninth, tenth, eleventh, and twelfth
rib fractures.

## 2022-10-14 IMAGING — DX DG CHEST 1V
1 series · 1 of 1 positions shown · non-contrast
Comparison: 08/21/2020

CLINICAL DATA: Fall, chest injury

EXAM:
CHEST  1 VIEW

[chest ap]
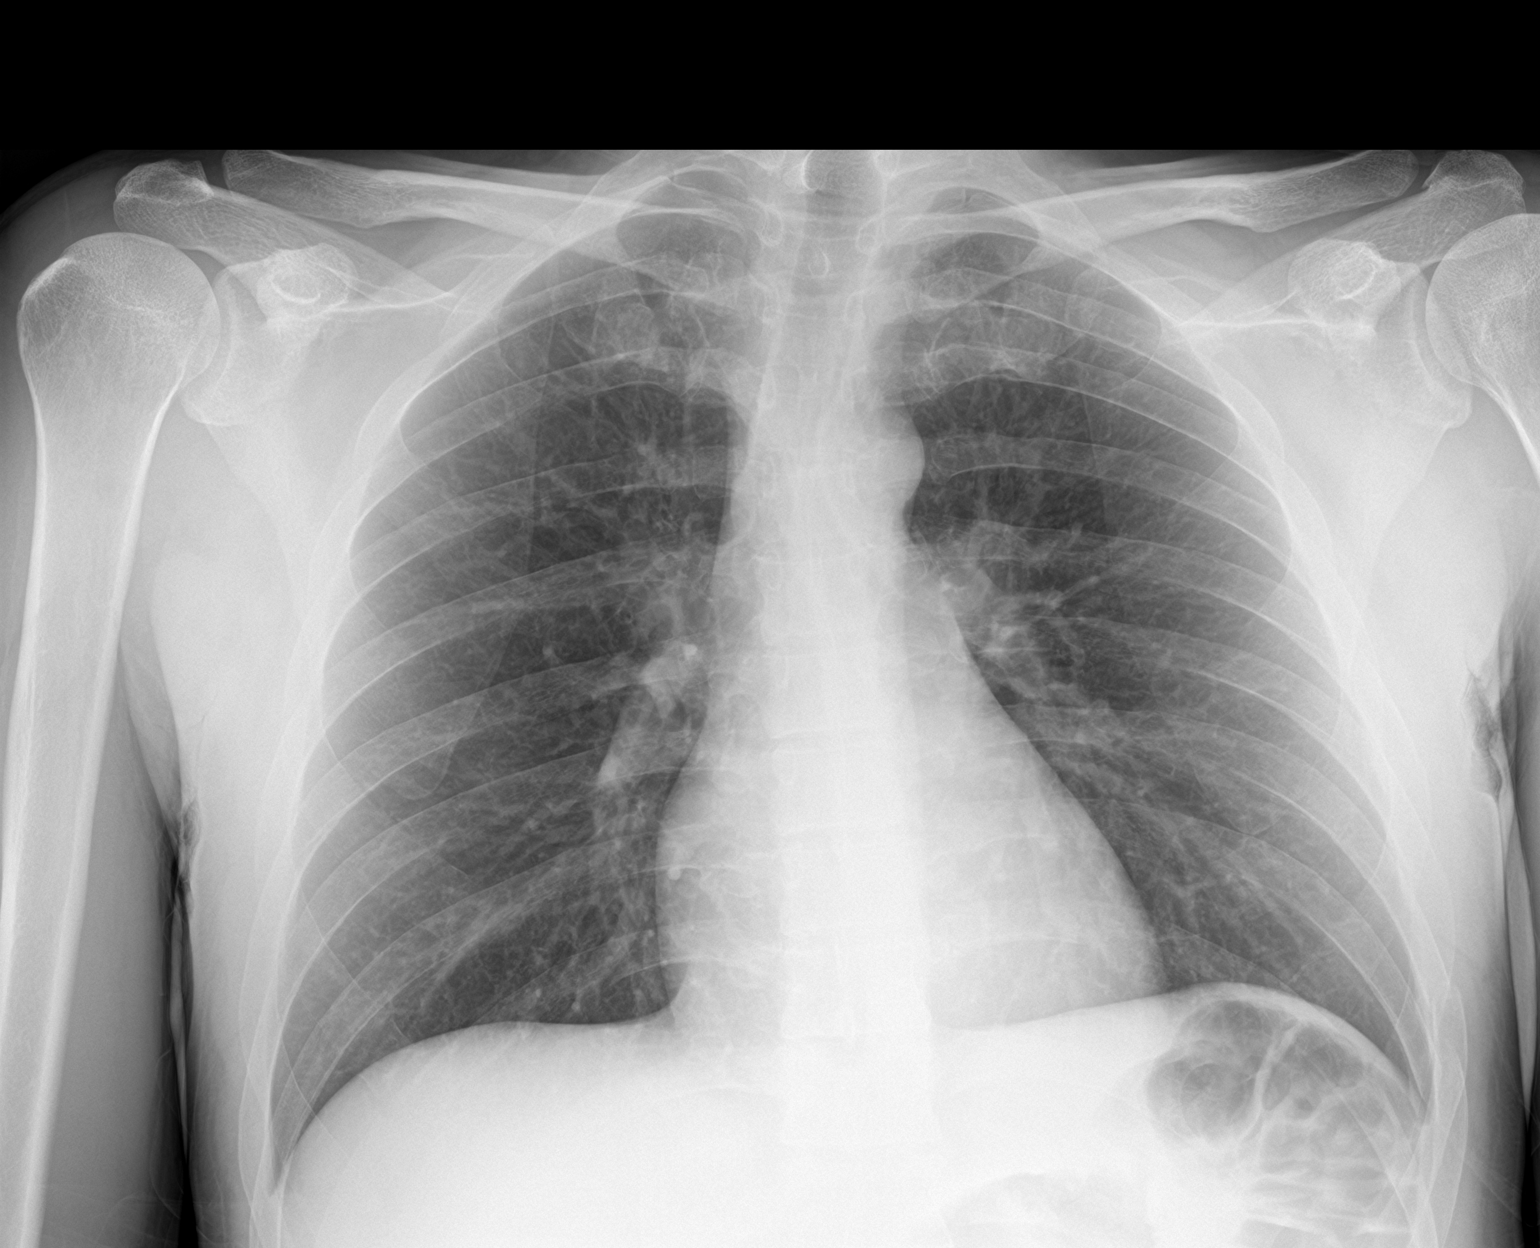

[1 of 1 positions shown; findings below may reference images not displayed]

FINDINGS: The heart size and mediastinal contours are within normal limits.
Both lungs are clear. The visualized skeletal structures are
unremarkable.
IMPRESSION: No active disease.

## 2022-10-14 MED ORDER — PIPERACILLIN-TAZOBACTAM 3.375 G IVPB 30 MIN
3.3750 g | Freq: Once | INTRAVENOUS | Status: AC
  Administered 2022-10-14: 3.375 g via INTRAVENOUS
  Filled 2022-10-14 (×2): qty 50

## 2022-10-14 MED ORDER — SODIUM CHLORIDE 0.9% FLUSH: 10.0000 mL | INTRAVENOUS | Status: DC | PRN

## 2022-10-14 MED ORDER — IOHEXOL 9 MG/ML PO SOLN
500.0000 mL | ORAL | Status: AC
  Administered 2022-10-14 (×2): 500 mL via ORAL

## 2022-10-14 MED ORDER — IOHEXOL 350 MG/ML SOLN
75.0000 mL | Freq: Once | INTRAVENOUS | Status: AC | PRN
  Administered 2022-10-14: 75 mL via INTRAVENOUS

## 2022-10-14 MED ORDER — SODIUM CHLORIDE 0.9% FLUSH
10.0000 mL | Freq: Two times a day (BID) | INTRAVENOUS | Status: DC
  Administered 2022-10-15 – 2022-10-19 (×7): 10 mL
  Administered 2022-10-19: 30 mL
  Administered 2022-10-20 (×2): 10 mL
  Administered 2022-10-21: 30 mL
  Administered 2022-10-22: 10 mL
  Administered 2022-10-23: 30 mL
  Administered 2022-10-23 (×2): 10 mL
  Administered 2022-10-24: 30 mL
  Administered 2022-10-24 – 2022-10-25 (×2): 10 mL
  Administered 2022-10-25: 20 mL
  Administered 2022-10-26 – 2022-11-05 (×19): 10 mL

## 2022-10-14 MED ORDER — PIPERACILLIN-TAZOBACTAM 3.375 G IVPB 30 MIN: 3.3750 g | Freq: Three times a day (TID) | INTRAVENOUS | Status: DC

## 2022-10-14 MED ORDER — PIPERACILLIN-TAZOBACTAM 3.375 G IVPB
3.3750 g | Freq: Three times a day (TID) | INTRAVENOUS | Status: DC
  Administered 2022-10-14 – 2022-10-15 (×3): 3.375 g via INTRAVENOUS
  Filled 2022-10-14 (×3): qty 50

## 2022-10-14 MED ORDER — INSULIN ASPART 100 UNIT/ML IJ SOLN
0.0000 [IU] | INTRAMUSCULAR | Status: DC
  Administered 2022-10-14 – 2022-10-25 (×9): 2 [IU] via SUBCUTANEOUS

## 2022-10-14 NOTE — Progress Notes (Signed)
Interventional Radiology Brief Note:  Pt s/p CT this AM with subsequent extravasation of saline/contrast (approx 50 mL) into left antecubital venous acess site.  Area currently soft but edematous.  No blisters or immediate skin breakdown noted. Exam limited by patient intubation/sedation. IV to be removed by RN at bedside.    Recommend cool compress (can alternate with warm compresses as needed) to site and arm elevation as much as possible over next 24 hrs to promote adequate venous drainage.  Mark margins of affected area with pen. RN made aware.  Will follow-up tomorrow.    Brynda Greathouse, MS RD PA-C

## 2022-10-14 NOTE — Progress Notes (Signed)
Peripherally Inserted Central Catheter Placement  The IV Nurse has discussed with the patient and/or persons authorized to consent for the patient, the purpose of this procedure and the potential benefits and risks involved with this procedure.  The benefits include less needle sticks, lab draws from the catheter, and the patient may be discharged home with the catheter. Risks include, but not limited to, infection, bleeding, blood clot (thrombus formation), and puncture of an artery; nerve damage and irregular heartbeat and possibility to perform a PICC exchange if needed/ordered by physician.  Alternatives to this procedure were also discussed.  Bard Power PICC patient education guide, fact sheet on infection prevention and patient information card has been provided to patient /or left at bedside.    PICC Placement Documentation  PICC Triple Lumen 25/95/63 Right Basilic 42 cm 0 cm (Active)  Indication for Insertion or Continuance of Line Poor Vasculature-patient has had multiple peripheral attempts or PIVs lasting less than 24 hours 10/14/22 1538  Exposed Catheter (cm) 0 cm 10/14/22 1538  Site Assessment Clean, Dry, Intact 10/14/22 1538  Lumen #1 Status Flushed;Saline locked;Blood return noted 10/14/22 1538  Lumen #2 Status Flushed;Saline locked;Blood return noted 10/14/22 1538  Lumen #3 Status Flushed;Saline locked;Blood return noted 10/14/22 1538  Dressing Type Transparent;Securing device 10/14/22 1538  Dressing Status Antimicrobial disc in place;Clean, Dry, Intact 10/14/22 1538  Safety Lock Not Applicable 87/56/43 3295  Line Adjustment (NICU/IV Team Only) No 10/14/22 1538  Dressing Intervention New dressing;Other (Comment) 10/14/22 1538  Dressing Change Due 10/21/22 10/14/22 1538    Patient's Timothy Black, Timothy Black, signed PICC consent via telephone. Verified by 2 PICC RNs. Patient already bruised on RUA prior to insertion.   [name] 10/14/2022, 3:41 PM
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
## 2022-10-14 NOTE — Progress Notes (Addendum)
Patient ID: Timothy Black, male   DOB: 08-10-1986, 37 y.o.   MRN: [id number] CT C/A/P reviewed. I gently pulled back the lower chest tube 2.5CM. RN replacing dressing. CT also showed evidence of PNA. Will send resp CX. [name], MD, MPH, FACS Please use [email or web address] to contact on call provider

## 2022-10-14 NOTE — Progress Notes (Signed)
PT Cancellation Note  Patient Details Name: Timothy Black MRN: [id number] DOB: [birth year]   Cancelled Treatment:    Reason Eval/Treat Not Completed: Medical issues which prohibited therapy.  Not appropriate, still on vent.  Per RN 10/14/2022  [name], PT Acute Rehabilitation Services [phone]  (office)   [name] Low 10/14/2022, 10:57 AM

## 2022-10-15 ENCOUNTER — Inpatient Hospital Stay (HOSPITAL_COMMUNITY): Payer: Medicaid Other

## 2022-10-15 DIAGNOSIS — R609 Edema, unspecified: Secondary | ICD-10-CM

## 2022-10-15 LAB — POCT I-STAT 7, (LYTES, BLD GAS, ICA,H+H)
Acid-Base Excess: 1 mmol/L (ref 0.0–2.0)
Bicarbonate: 25.6 mmol/L (ref 20.0–28.0)
Calcium, Ion: 1.16 mmol/L (ref 1.15–1.40)
HCT: 20 % — ABNORMAL LOW (ref 39.0–52.0)
Hemoglobin: 6.8 g/dL — CL (ref 13.0–17.0)
O2 Saturation: 95 %
Patient temperature: 98.8
Potassium: 3.8 mmol/L (ref 3.5–5.1)
Sodium: 147 mmol/L — ABNORMAL HIGH (ref 135–145)
TCO2: 27 mmol/L (ref 22–32)
pCO2 arterial: 40 mmHg (ref 32–48)
pH, Arterial: 7.415 (ref 7.35–7.45)
pO2, Arterial: 74 mmHg — ABNORMAL LOW (ref 83–108)

## 2022-10-15 LAB — GLUCOSE, CAPILLARY
Glucose-Capillary: 100 mg/dL — ABNORMAL HIGH (ref 70–99)
Glucose-Capillary: 112 mg/dL — ABNORMAL HIGH (ref 70–99)
Glucose-Capillary: 114 mg/dL — ABNORMAL HIGH (ref 70–99)
Glucose-Capillary: 119 mg/dL — ABNORMAL HIGH (ref 70–99)
Glucose-Capillary: 128 mg/dL — ABNORMAL HIGH (ref 70–99)
Glucose-Capillary: 132 mg/dL — ABNORMAL HIGH (ref 70–99)

## 2022-10-15 LAB — CBC
HCT: 22.1 % — ABNORMAL LOW (ref 39.0–52.0)
HCT: 24.2 % — ABNORMAL LOW (ref 39.0–52.0)
Hemoglobin: 7.2 g/dL — ABNORMAL LOW (ref 13.0–17.0)
Hemoglobin: 7.7 g/dL — ABNORMAL LOW (ref 13.0–17.0)
MCH: 30.1 pg (ref 26.0–34.0)
MCH: 29.3 pg (ref 26.0–34.0)
MCHC: 32.6 g/dL (ref 30.0–36.0)
MCHC: 31.8 g/dL (ref 30.0–36.0)
MCV: 92.5 fL (ref 80.0–100.0)
MCV: 92 fL (ref 80.0–100.0)
Platelets: 353 10*3/uL (ref 150–400)
Platelets: 421 10*3/uL — ABNORMAL HIGH (ref 150–400)
RBC: 2.39 MIL/uL — ABNORMAL LOW (ref 4.22–5.81)
RBC: 2.63 MIL/uL — ABNORMAL LOW (ref 4.22–5.81)
RDW: 14.7 % (ref 11.5–15.5)
RDW: 14.8 % (ref 11.5–15.5)
WBC: 31.7 10*3/uL — ABNORMAL HIGH (ref 4.0–10.5)
WBC: 30.8 10*3/uL — ABNORMAL HIGH (ref 4.0–10.5)
nRBC: 0.1 % (ref 0.0–0.2)
nRBC: 0.2 % (ref 0.0–0.2)

## 2022-10-15 LAB — BASIC METABOLIC PANEL
Anion gap: 7 (ref 5–15)
BUN: 26 mg/dL — ABNORMAL HIGH (ref 6–20)
CO2: 27 mmol/L (ref 22–32)
Calcium: 7.5 mg/dL — ABNORMAL LOW (ref 8.9–10.3)
Chloride: 110 mmol/L (ref 98–111)
Creatinine, Ser: 0.65 mg/dL (ref 0.61–1.24)
GFR, Estimated: >60 mL/min (ref >60)
Glucose, Bld: 107 mg/dL — ABNORMAL HIGH (ref 70–99)
Potassium: 3.8 mmol/L (ref 3.5–5.1)
Sodium: 144 mmol/L (ref 135–145)

## 2022-10-15 LAB — HEPATIC FUNCTION PANEL
ALT: 21 U/L (ref 0–44)
AST: 25 U/L (ref 15–41)
Albumin: <1.5 g/dL — ABNORMAL LOW (ref 3.5–5.0)
Alkaline Phosphatase: 178 U/L — ABNORMAL HIGH (ref 38–126)
Bilirubin, Direct: 0.8 mg/dL — ABNORMAL HIGH (ref 0.0–0.2)
Indirect Bilirubin: 0.5 mg/dL (ref 0.3–0.9)
Total Bilirubin: 1.3 mg/dL — ABNORMAL HIGH (ref 0.3–1.2)
Total Protein: 4.9 g/dL — ABNORMAL LOW (ref 6.5–8.1)

## 2022-10-15 MED ORDER — DIAZEPAM 5 MG PO TABS
10.0000 mg | ORAL_TABLET | Freq: Four times a day (QID) | ORAL | Status: DC
  Administered 2022-10-15 – 2022-10-16 (×4): 10 mg
  Filled 2022-10-15 (×4): qty 2

## 2022-10-15 MED ORDER — FUROSEMIDE 10 MG/ML IJ SOLN
40.0000 mg | Freq: Once | INTRAMUSCULAR | Status: AC
  Administered 2022-10-15: 40 mg via INTRAVENOUS
  Filled 2022-10-15: qty 4

## 2022-10-15 MED ORDER — OXYCODONE HCL 5 MG PO TABS
10.0000 mg | ORAL_TABLET | ORAL | Status: DC
  Administered 2022-10-15 – 2022-10-18 (×19): 10 mg
  Filled 2022-10-15 (×18): qty 2

## 2022-10-15 MED ORDER — SODIUM CHLORIDE 0.9 % IV SOLN
2.0000 g | INTRAVENOUS | Status: AC
  Administered 2022-10-15 – 2022-10-20 (×6): 2 g via INTRAVENOUS
  Filled 2022-10-15 (×7): qty 20

## 2022-10-15 MED ORDER — MIDAZOLAM BOLUS VIA INFUSION
0.0000 mg | INTRAVENOUS | Status: DC | PRN
  Administered 2022-10-16 – 2022-10-19 (×3): 5 mg via INTRAVENOUS
  Administered 2022-10-19: 4 mg via INTRAVENOUS
  Administered 2022-10-19: 5 mg via INTRAVENOUS
  Administered 2022-10-19: 4 mg via INTRAVENOUS
  Administered 2022-10-20 – 2022-10-21 (×3): 5 mg via INTRAVENOUS
  Administered 2022-10-23: 2 mg via INTRAVENOUS
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
## 2022-10-15 NOTE — Progress Notes (Signed)
  Follow up after Contrast Extravasation  Patient seen and examined immediately after contrast extravasation while in CT yesterday.  Examined bedside today.  Exam: There is no swelling at the right upper arm area.  There is no erythema. There is no discoloration. There are no blisters. There are no signs of decreased perfusion of the skin.  It is warm to touch.  Radial pulse is normal.  Radiology to sign off. Available as needed.  Daviona Herbert PA-C 10/15/2022 4:33 PM
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
## 2022-10-15 NOTE — Progress Notes (Signed)
OT Cancellation Note  Patient Details Name: Timothy Black MRN: [id number] DOB: [birth year]   Cancelled Treatment:    Reason Eval/Treat Not Completed: Medical issues which prohibited therapy.  Pt is still intubated and per nursing not appropriate for therapy at this time.  Will continue to check back daily.    Jalen Daluz OTR/L 10/15/2022, 12:57 PM

## 2022-10-15 NOTE — Progress Notes (Signed)
Lower extremity venous bilateral study completed.   Please see CV Proc for preliminary results.   Banjamin Stovall, RDMS, RVT  
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
## 2022-10-16 ENCOUNTER — Inpatient Hospital Stay (HOSPITAL_COMMUNITY): Payer: Medicaid Other

## 2022-10-16 LAB — CBC
HCT: 19.7 % — ABNORMAL LOW (ref 39.0–52.0)
Hemoglobin: 6.4 g/dL — CL (ref 13.0–17.0)
MCH: 30 pg (ref 26.0–34.0)
MCHC: 32.5 g/dL (ref 30.0–36.0)
MCV: 92.5 fL (ref 80.0–100.0)
Platelets: 328 10*3/uL (ref 150–400)
RBC: 2.13 MIL/uL — ABNORMAL LOW (ref 4.22–5.81)
RDW: 15.1 % (ref 11.5–15.5)
WBC: 19.8 10*3/uL — ABNORMAL HIGH (ref 4.0–10.5)
nRBC: 0.2 % (ref 0.0–0.2)

## 2022-10-16 LAB — GLUCOSE, CAPILLARY
Glucose-Capillary: 120 mg/dL — ABNORMAL HIGH (ref 70–99)
Glucose-Capillary: 110 mg/dL — ABNORMAL HIGH (ref 70–99)
Glucose-Capillary: 114 mg/dL — ABNORMAL HIGH (ref 70–99)
Glucose-Capillary: 117 mg/dL — ABNORMAL HIGH (ref 70–99)
Glucose-Capillary: 103 mg/dL — ABNORMAL HIGH (ref 70–99)
Glucose-Capillary: 103 mg/dL — ABNORMAL HIGH (ref 70–99)

## 2022-10-16 LAB — BASIC METABOLIC PANEL
Anion gap: 4 — ABNORMAL LOW (ref 5–15)
BUN: 29 mg/dL — ABNORMAL HIGH (ref 6–20)
CO2: 30 mmol/L (ref 22–32)
Calcium: 7.4 mg/dL — ABNORMAL LOW (ref 8.9–10.3)
Chloride: 113 mmol/L — ABNORMAL HIGH (ref 98–111)
Creatinine, Ser: 0.8 mg/dL (ref 0.61–1.24)
GFR, Estimated: >60 mL/min (ref >60)
Glucose, Bld: 115 mg/dL — ABNORMAL HIGH (ref 70–99)
Potassium: 3.7 mmol/L (ref 3.5–5.1)
Sodium: 147 mmol/L — ABNORMAL HIGH (ref 135–145)

## 2022-10-16 LAB — PREPARE RBC (CROSSMATCH)

## 2022-10-16 MED ORDER — QUETIAPINE FUMARATE 25 MG PO TABS
50.0000 mg | ORAL_TABLET | Freq: Two times a day (BID) | ORAL | Status: DC
  Administered 2022-10-16 (×2): 50 mg
  Filled 2022-10-16 (×2): qty 2

## 2022-10-16 MED ORDER — SODIUM CHLORIDE 0.9% IV SOLUTION: Freq: Once | INTRAVENOUS | Status: AC

## 2022-10-16 MED ORDER — DIAZEPAM 5 MG PO TABS
15.0000 mg | ORAL_TABLET | Freq: Four times a day (QID) | ORAL | Status: DC
  Administered 2022-10-16 – 2022-10-18 (×9): 15 mg
  Filled 2022-10-16 (×9): qty 3
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
## 2022-10-16 NOTE — Progress Notes (Signed)
PT Cancellation Note  Patient Details Name: Timothy Black MRN: [id number] DOB: Aug 05, 1986   Cancelled Treatment:    Reason Eval/Treat Not Completed: Medical issues which prohibited therapy.  Will sign off and await activity orders when pt is able to participate. 10/16/2022  [name], PT Acute Rehabilitation Services [phone]  (office)   [name] [name] 10/16/2022, 10:22 AM

## 2022-10-17 ENCOUNTER — Inpatient Hospital Stay (HOSPITAL_COMMUNITY): Payer: Medicaid Other

## 2022-10-17 LAB — BASIC METABOLIC PANEL
Anion gap: 8 (ref 5–15)
BUN: 21 mg/dL — ABNORMAL HIGH (ref 6–20)
CO2: 25 mmol/L (ref 22–32)
Calcium: 7.7 mg/dL — ABNORMAL LOW (ref 8.9–10.3)
Chloride: 113 mmol/L — ABNORMAL HIGH (ref 98–111)
Creatinine, Ser: 0.54 mg/dL — ABNORMAL LOW (ref 0.61–1.24)
GFR, Estimated: >60 mL/min (ref >60)
Glucose, Bld: 104 mg/dL — ABNORMAL HIGH (ref 70–99)
Potassium: 4.3 mmol/L (ref 3.5–5.1)
Sodium: 146 mmol/L — ABNORMAL HIGH (ref 135–145)

## 2022-10-17 LAB — GLUCOSE, CAPILLARY
Glucose-Capillary: 104 mg/dL — ABNORMAL HIGH (ref 70–99)
Glucose-Capillary: 97 mg/dL (ref 70–99)
Glucose-Capillary: 97 mg/dL (ref 70–99)
Glucose-Capillary: 96 mg/dL (ref 70–99)
Glucose-Capillary: 103 mg/dL — ABNORMAL HIGH (ref 70–99)
Glucose-Capillary: 119 mg/dL — ABNORMAL HIGH (ref 70–99)

## 2022-10-17 LAB — CBC
HCT: 25.4 % — ABNORMAL LOW (ref 39.0–52.0)
Hemoglobin: 7.8 g/dL — ABNORMAL LOW (ref 13.0–17.0)
MCH: 28.9 pg (ref 26.0–34.0)
MCHC: 30.7 g/dL (ref 30.0–36.0)
MCV: 94.1 fL (ref 80.0–100.0)
Platelets: 419 10*3/uL — ABNORMAL HIGH (ref 150–400)
RBC: 2.7 MIL/uL — ABNORMAL LOW (ref 4.22–5.81)
RDW: 15.9 % — ABNORMAL HIGH (ref 11.5–15.5)
WBC: 18.2 10*3/uL — ABNORMAL HIGH (ref 4.0–10.5)
nRBC: 0 % (ref 0.0–0.2)

## 2022-10-17 LAB — BPAM RBC
Blood Product Expiration Date: 202402122359
ISSUE DATE / TIME: 202401171104
Unit Type and Rh: 6200

## 2022-10-17 LAB — TYPE AND SCREEN
ABO/RH(D): A POS
Antibody Screen: NEGATIVE
Unit division: 0

## 2022-10-17 MED ORDER — SODIUM CHLORIDE 0.9 % IV SOLN: INTRAVENOUS | Status: DC | PRN

## 2022-10-17 MED ORDER — BANATROL TF EN LIQD
60.0000 mL | Freq: Two times a day (BID) | ENTERAL | Status: DC
  Administered 2022-10-17 – 2022-10-23 (×13): 60 mL
  Filled 2022-10-17 (×13): qty 60

## 2022-10-17 MED ORDER — QUETIAPINE FUMARATE 100 MG PO TABS
100.0000 mg | ORAL_TABLET | Freq: Two times a day (BID) | ORAL | Status: DC
  Administered 2022-10-17 (×2): 100 mg
  Filled 2022-10-17 (×2): qty 1

## 2022-10-17 NOTE — Plan of Care (Signed)
  Problem: Safety: Goal: Non-violent Restraint(s) Outcome: Progressing   Problem: Education: Goal: Knowledge of General Education information will improve Description: Including pain rating scale, medication(s)/side effects and non-pharmacologic comfort measures Outcome: Progressing   Problem: Health Behavior/Discharge Planning: Goal: Ability to manage health-related needs will improve Outcome: Progressing   Problem: Clinical Measurements: Goal: Ability to maintain clinical measurements within normal limits will improve Outcome: Progressing Goal: Will remain free from infection Outcome: Progressing Goal: Diagnostic test results will improve Outcome: Progressing Goal: Respiratory complications will improve Outcome: Progressing Goal: Cardiovascular complication will be avoided Outcome: Progressing   Problem: Elimination: Goal: Will not experience complications related to bowel motility Outcome: Progressing Goal: Will not experience complications related to urinary retention Outcome: Progressing   Problem: Safety: Goal: Ability to remain free from injury will improve Outcome: Progressing   Problem: Education: Goal: Ability to describe self-care measures that may prevent or decrease complications (Diabetes Survival Skills Education) will improve Outcome: Progressing Goal: Individualized Educational Video(s) Outcome: Progressing   Problem: Nutritional: Goal: Maintenance of adequate nutrition will improve Outcome: Progressing Goal: Progress toward achieving an optimal weight will improve Outcome: Progressing   Problem: Tissue Perfusion: Goal: Adequacy of tissue perfusion will improve Outcome: Progressing
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
## 2022-10-17 NOTE — Progress Notes (Signed)
OT Cancellation Note  Patient Details Name: Timothy Black MRN: [id number] DOB: 1986/04/26   Cancelled Treatment:    Reason Eval/Treat Not Completed: Medical issues which prohibited therapy.  Pt currently not appropriate for therapy at this time secondary to ongoing medical needs.  Will sign off for OT services at this time.  MD please re-order OT when appropriate to participate.  Thanks!  Demarco Bacci OTR/L 10/17/2022, 1:35 PM
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
## 2022-10-17 NOTE — Progress Notes (Signed)
Pt peak pressuring due to biting tube. Orange bite block placed.

## 2022-10-18 ENCOUNTER — Inpatient Hospital Stay (HOSPITAL_COMMUNITY): Payer: Medicaid Other

## 2022-10-18 LAB — CBC
HCT: 24.3 % — ABNORMAL LOW (ref 39.0–52.0)
Hemoglobin: 7.9 g/dL — ABNORMAL LOW (ref 13.0–17.0)
MCH: 29.6 pg (ref 26.0–34.0)
MCHC: 32.5 g/dL (ref 30.0–36.0)
MCV: 91 fL (ref 80.0–100.0)
Platelets: 399 10*3/uL (ref 150–400)
RBC: 2.67 MIL/uL — ABNORMAL LOW (ref 4.22–5.81)
RDW: 15.7 % — ABNORMAL HIGH (ref 11.5–15.5)
WBC: 14.1 10*3/uL — ABNORMAL HIGH (ref 4.0–10.5)
nRBC: 0.3 % — ABNORMAL HIGH (ref 0.0–0.2)

## 2022-10-18 LAB — CULTURE, RESPIRATORY W GRAM STAIN

## 2022-10-18 LAB — BASIC METABOLIC PANEL
Anion gap: 6 (ref 5–15)
BUN: 17 mg/dL (ref 6–20)
CO2: 26 mmol/L (ref 22–32)
Calcium: 7.6 mg/dL — ABNORMAL LOW (ref 8.9–10.3)
Chloride: 110 mmol/L (ref 98–111)
Creatinine, Ser: 0.63 mg/dL (ref 0.61–1.24)
GFR, Estimated: >60 mL/min (ref >60)
Glucose, Bld: 106 mg/dL — ABNORMAL HIGH (ref 70–99)
Potassium: 4.3 mmol/L (ref 3.5–5.1)
Sodium: 142 mmol/L (ref 135–145)

## 2022-10-18 LAB — GLUCOSE, CAPILLARY
Glucose-Capillary: 107 mg/dL — ABNORMAL HIGH (ref 70–99)
Glucose-Capillary: 86 mg/dL (ref 70–99)
Glucose-Capillary: 103 mg/dL — ABNORMAL HIGH (ref 70–99)
Glucose-Capillary: 97 mg/dL (ref 70–99)
Glucose-Capillary: 102 mg/dL — ABNORMAL HIGH (ref 70–99)

## 2022-10-18 MED ORDER — QUETIAPINE FUMARATE 200 MG PO TABS
200.0000 mg | ORAL_TABLET | Freq: Two times a day (BID) | ORAL | Status: DC
  Administered 2022-10-18 – 2022-10-25 (×16): 200 mg
  Filled 2022-10-18 (×17): qty 1

## 2022-10-18 MED ORDER — OXYCODONE HCL 5 MG PO TABS
15.0000 mg | ORAL_TABLET | ORAL | Status: DC
  Administered 2022-10-18 – 2022-10-19 (×5): 15 mg
  Filled 2022-10-18 (×5): qty 3

## 2022-10-18 MED ORDER — LACTATED RINGERS IV BOLUS
500.0000 mL | Freq: Once | INTRAVENOUS | Status: AC
  Administered 2022-10-18: 500 mL via INTRAVENOUS

## 2022-10-18 MED ORDER — DIAZEPAM 5 MG PO TABS
20.0000 mg | ORAL_TABLET | Freq: Four times a day (QID) | ORAL | Status: DC
  Administered 2022-10-18 – 2022-10-25 (×27): 20 mg
  Filled 2022-10-18 (×28): qty 4

## 2022-10-18 MED ORDER — METOPROLOL TARTRATE 5 MG/5ML IV SOLN
5.0000 mg | Freq: Once | INTRAVENOUS | Status: AC
  Administered 2022-10-18: 5 mg via INTRAVENOUS
  Filled 2022-10-18: qty 5
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
## 2022-10-18 NOTE — Plan of Care (Signed)
Patient received on [facility] settings as charted. PSV weaning trial attempted but patient did not tolerate this. PIP and RR escalated and patient was placed back on full vent support.

## 2022-10-18 NOTE — Procedures (Signed)
Cortrak  Person Inserting Tube:  Starsha Morning, RD Tube Type:  Cortrak - 43 inches Tube Size:  10 Tube Location:  Left nare Secured by: Bridle Technique Used to Measure Tube Placement:  Marking at nare/corner of mouth Cortrak Secured At:  64 cm Procedure Comments:  Cortrak Tube Team Note:  Consult received to place a Cortrak feeding tube.   X-ray is required, abdominal x-ray has been ordered by the Cortrak team. Please confirm tube placement before using the Cortrak tube.   If the tube becomes dislodged please keep the tube and contact the Cortrak team at [email or web address] for replacement.  If after hours and replacement cannot be delayed, place a NG tube and confirm placement with an abdominal x-ray.     Theodore Virgin MS, RDN, LDN, CNSC Registered Dietitian 3 Clinical Nutrition RD Pager and On-Call Pager Number Located in Amion       

## 2022-10-19 ENCOUNTER — Inpatient Hospital Stay (HOSPITAL_COMMUNITY): Payer: Medicaid Other

## 2022-10-19 LAB — BASIC METABOLIC PANEL
Anion gap: 8 (ref 5–15)
BUN: 14 mg/dL (ref 6–20)
CO2: 28 mmol/L (ref 22–32)
Calcium: 7.7 mg/dL — ABNORMAL LOW (ref 8.9–10.3)
Chloride: 102 mmol/L (ref 98–111)
Creatinine, Ser: 0.48 mg/dL — ABNORMAL LOW (ref 0.61–1.24)
GFR, Estimated: >60 mL/min (ref >60)
Glucose, Bld: 100 mg/dL — ABNORMAL HIGH (ref 70–99)
Potassium: 4.1 mmol/L (ref 3.5–5.1)
Sodium: 138 mmol/L (ref 135–145)

## 2022-10-19 LAB — CBC
HCT: 22 % — ABNORMAL LOW (ref 39.0–52.0)
Hemoglobin: 7.1 g/dL — ABNORMAL LOW (ref 13.0–17.0)
MCH: 30.1 pg (ref 26.0–34.0)
MCHC: 32.3 g/dL (ref 30.0–36.0)
MCV: 93.2 fL (ref 80.0–100.0)
Platelets: 433 10*3/uL — ABNORMAL HIGH (ref 150–400)
RBC: 2.36 MIL/uL — ABNORMAL LOW (ref 4.22–5.81)
RDW: 15.4 % (ref 11.5–15.5)
WBC: 16 10*3/uL — ABNORMAL HIGH (ref 4.0–10.5)
nRBC: 0 % (ref 0.0–0.2)

## 2022-10-19 LAB — GLUCOSE, CAPILLARY
Glucose-Capillary: 100 mg/dL — ABNORMAL HIGH (ref 70–99)
Glucose-Capillary: 101 mg/dL — ABNORMAL HIGH (ref 70–99)
Glucose-Capillary: 102 mg/dL — ABNORMAL HIGH (ref 70–99)
Glucose-Capillary: 114 mg/dL — ABNORMAL HIGH (ref 70–99)
Glucose-Capillary: 125 mg/dL — ABNORMAL HIGH (ref 70–99)
Glucose-Capillary: 97 mg/dL (ref 70–99)
Glucose-Capillary: 99 mg/dL (ref 70–99)

## 2022-10-19 MED ORDER — OXYCODONE HCL 5 MG PO TABS
10.0000 mg | ORAL_TABLET | ORAL | Status: DC | PRN
  Administered 2022-10-21 – 2022-10-22 (×4): 10 mg
  Administered 2022-10-22: 15 mg
  Administered 2022-10-22: 10 mg
  Administered 2022-10-25 – 2022-10-27 (×9): 15 mg
  Administered 2022-10-28: 10 mg
  Administered 2022-10-28: 15 mg
  Filled 2022-10-19 (×4): qty 3
  Filled 2022-10-19 (×3): qty 2
  Filled 2022-10-19 (×2): qty 3
  Filled 2022-10-19 (×3): qty 2
  Filled 2022-10-19 (×6): qty 3

## 2022-10-19 MED ORDER — OXYCODONE HCL 5 MG PO TABS
20.0000 mg | ORAL_TABLET | ORAL | Status: DC
  Administered 2022-10-19 – 2022-10-26 (×41): 20 mg
  Filled 2022-10-19 (×41): qty 4

## 2022-10-19 MED ORDER — IOHEXOL 9 MG/ML PO SOLN
500.0000 mL | ORAL | Status: AC
  Administered 2022-10-19 (×2): 500 mL via ORAL

## 2022-10-19 MED ORDER — IOHEXOL 350 MG/ML SOLN
75.0000 mL | Freq: Once | INTRAVENOUS | Status: AC | PRN
  Administered 2022-10-19: 75 mL via INTRAVENOUS
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
## 2022-10-20 LAB — URINALYSIS, COMPLETE (UACMP) WITH MICROSCOPIC
Bacteria, UA: NONE SEEN
Bilirubin Urine: NEGATIVE
Glucose, UA: NEGATIVE mg/dL
Hgb urine dipstick: NEGATIVE
Ketones, ur: NEGATIVE mg/dL
Leukocytes,Ua: NEGATIVE
Nitrite: NEGATIVE
Protein, ur: NEGATIVE mg/dL
Specific Gravity, Urine: 1.013 (ref 1.005–1.030)
pH: 7 (ref 5.0–8.0)

## 2022-10-20 LAB — CBC
HCT: 20.8 % — ABNORMAL LOW (ref 39.0–52.0)
Hemoglobin: 6.7 g/dL — CL (ref 13.0–17.0)
MCH: 29.9 pg (ref 26.0–34.0)
MCHC: 32.2 g/dL (ref 30.0–36.0)
MCV: 92.9 fL (ref 80.0–100.0)
Platelets: 489 10*3/uL — ABNORMAL HIGH (ref 150–400)
RBC: 2.24 MIL/uL — ABNORMAL LOW (ref 4.22–5.81)
RDW: 14.6 % (ref 11.5–15.5)
WBC: 15.2 10*3/uL — ABNORMAL HIGH (ref 4.0–10.5)
nRBC: 0 % (ref 0.0–0.2)

## 2022-10-20 LAB — BASIC METABOLIC PANEL
Anion gap: 7 (ref 5–15)
BUN: 12 mg/dL (ref 6–20)
CO2: 29 mmol/L (ref 22–32)
Calcium: 7.7 mg/dL — ABNORMAL LOW (ref 8.9–10.3)
Chloride: 98 mmol/L (ref 98–111)
Creatinine, Ser: 0.48 mg/dL — ABNORMAL LOW (ref 0.61–1.24)
GFR, Estimated: >60 mL/min (ref >60)
Glucose, Bld: 112 mg/dL — ABNORMAL HIGH (ref 70–99)
Potassium: 4.3 mmol/L (ref 3.5–5.1)
Sodium: 134 mmol/L — ABNORMAL LOW (ref 135–145)

## 2022-10-20 LAB — PREPARE RBC (CROSSMATCH)

## 2022-10-20 LAB — GLUCOSE, CAPILLARY
Glucose-Capillary: 106 mg/dL — ABNORMAL HIGH (ref 70–99)
Glucose-Capillary: 105 mg/dL — ABNORMAL HIGH (ref 70–99)
Glucose-Capillary: 107 mg/dL — ABNORMAL HIGH (ref 70–99)
Glucose-Capillary: 95 mg/dL (ref 70–99)
Glucose-Capillary: 113 mg/dL — ABNORMAL HIGH (ref 70–99)
Glucose-Capillary: 106 mg/dL — ABNORMAL HIGH (ref 70–99)

## 2022-10-20 LAB — HEMOGLOBIN AND HEMATOCRIT, BLOOD
HCT: 22.5 % — ABNORMAL LOW (ref 39.0–52.0)
Hemoglobin: 7.1 g/dL — ABNORMAL LOW (ref 13.0–17.0)

## 2022-10-20 MED ORDER — SODIUM CHLORIDE 0.9% IV SOLUTION: Freq: Once | INTRAVENOUS | Status: AC

## 2022-10-20 NOTE — Progress Notes (Signed)
Staples removed per order. Steristrips applied. Algenate applied to small opening, with drainage. WOC ordered

## 2022-10-20 NOTE — Consult Note (Signed)
WOC Nurse Consult Note: Reason for Consult:small opening at midline wound post staple removal, wound dehiscence Wound type:surgical Pressure Injury POA: N/A Measurement:Per Bedside RN and WTA M. [place] on Nursing Flow Sheet: 0.4cm x 0.4cm x 0.5cm Wound bed:red, moist Drainage (amount, consistency, odor) Moderate purulent Periwound:mild erythema Dressing procedure/placement/frequency:I have provided nursing with guidance for topical wound care using a daily cleanse with NS, and filling/covering the opened area with a size appropriate pice (strip/ribbon) of silver hydrofiber (Aquacel Ag+ Sheliah Hatch # [id number]). This is to be changed daily.  Of course, if surgery offers another topical care order, their preference will supercede my guidance.  [place] nursing team will not follow, but will remain available to this patient, the nursing and medical teams.  Please re-consult if needed.  Thank you for inviting [place] to participate in this patient's Plan of Care.  Maudie Flakes, MSN, RN, CNS, [place], Serita Grammes, [employer], [employer] phone:  [phone]

## 2022-10-20 NOTE — Progress Notes (Signed)
   10/20/22 0530  Provider Notification  Provider Name/Title Dr. Bobbye Morton  Date Provider Notified 10/20/22  Time Provider Notified [phone]  Method of Notification Page  Notification Reason Critical Result  Test performed and critical result Hgb- 6.7  Date Critical Result Received 10/20/22  Time Critical Result Received 0507  Provider response No new orders  Date of Provider Response 10/20/22  Time of Provider Response [phone]   RN paged provider regarding critical lab (Hgb 6.7). MD currently operating in [place]. OR staff member returned RN's page on behalf of MD and stated that MD was unable to talk at the moment but she had notified MD of the critical result.
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
## 2022-10-21 ENCOUNTER — Encounter (HOSPITAL_COMMUNITY): Payer: Self-pay

## 2022-10-21 ENCOUNTER — Inpatient Hospital Stay (HOSPITAL_COMMUNITY): Payer: Medicaid Other

## 2022-10-21 ENCOUNTER — Other Ambulatory Visit: Payer: Self-pay

## 2022-10-21 LAB — BLOOD CULTURE ID PANEL (REFLEXED) - BCID2

## 2022-10-21 LAB — CBC
HCT: 23.5 % — ABNORMAL LOW (ref 39.0–52.0)
Hemoglobin: 7.6 g/dL — ABNORMAL LOW (ref 13.0–17.0)
MCH: 30 pg (ref 26.0–34.0)
MCHC: 32.3 g/dL (ref 30.0–36.0)
MCV: 92.9 fL (ref 80.0–100.0)
Platelets: 561 10*3/uL — ABNORMAL HIGH (ref 150–400)
RBC: 2.53 MIL/uL — ABNORMAL LOW (ref 4.22–5.81)
RDW: 14.5 % (ref 11.5–15.5)
WBC: 14.7 10*3/uL — ABNORMAL HIGH (ref 4.0–10.5)
nRBC: 0 % (ref 0.0–0.2)

## 2022-10-21 LAB — BPAM RBC
Blood Product Expiration Date: 202402212359
ISSUE DATE / TIME: 202401210945
Unit Type and Rh: 6200

## 2022-10-21 LAB — PHOSPHORUS: Phosphorus: 3.6 mg/dL (ref 2.5–4.6)

## 2022-10-21 LAB — GLUCOSE, CAPILLARY
Glucose-Capillary: 102 mg/dL — ABNORMAL HIGH (ref 70–99)
Glucose-Capillary: 107 mg/dL — ABNORMAL HIGH (ref 70–99)
Glucose-Capillary: 108 mg/dL — ABNORMAL HIGH (ref 70–99)
Glucose-Capillary: 113 mg/dL — ABNORMAL HIGH (ref 70–99)
Glucose-Capillary: 145 mg/dL — ABNORMAL HIGH (ref 70–99)
Glucose-Capillary: 99 mg/dL (ref 70–99)

## 2022-10-21 LAB — BASIC METABOLIC PANEL
Anion gap: 6 (ref 5–15)
BUN: 12 mg/dL (ref 6–20)
CO2: 28 mmol/L (ref 22–32)
Calcium: 7.6 mg/dL — ABNORMAL LOW (ref 8.9–10.3)
Chloride: 100 mmol/L (ref 98–111)
Creatinine, Ser: 0.46 mg/dL — ABNORMAL LOW (ref 0.61–1.24)
GFR, Estimated: >60 mL/min (ref >60)
Glucose, Bld: 117 mg/dL — ABNORMAL HIGH (ref 70–99)
Potassium: 4.3 mmol/L (ref 3.5–5.1)
Sodium: 134 mmol/L — ABNORMAL LOW (ref 135–145)

## 2022-10-21 LAB — TYPE AND SCREEN
ABO/RH(D): A POS
Antibody Screen: NEGATIVE
Unit division: 0

## 2022-10-21 LAB — MAGNESIUM: Magnesium: 2 mg/dL (ref 1.7–2.4)

## 2022-10-21 MED ORDER — SODIUM CHLORIDE 0.9 % IV SOLN
2.0000 g | Freq: Three times a day (TID) | INTRAVENOUS | Status: AC
  Administered 2022-10-21 – 2022-10-26 (×15): 2 g via INTRAVENOUS
  Filled 2022-10-21 (×15): qty 12.5

## 2022-10-21 MED ORDER — PNEUMOCOCCAL 20-VAL CONJ VACC 0.5 ML IM SUSY
0.5000 mL | PREFILLED_SYRINGE | INTRAMUSCULAR | Status: AC
  Administered 2022-10-22: 0.5 mL via INTRAMUSCULAR
  Filled 2022-10-21 (×2): qty 0.5

## 2022-10-21 MED ORDER — INFLUENZA VAC SPLIT QUAD 0.5 ML IM SUSY
0.5000 mL | PREFILLED_SYRINGE | INTRAMUSCULAR | Status: AC
  Administered 2022-10-22: 0.5 mL via INTRAMUSCULAR
  Filled 2022-10-21 (×2): qty 0.5
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
## 2022-10-22 ENCOUNTER — Inpatient Hospital Stay (HOSPITAL_COMMUNITY): Payer: Medicaid Other

## 2022-10-22 LAB — BASIC METABOLIC PANEL
Anion gap: 9 (ref 5–15)
BUN: 15 mg/dL (ref 6–20)
CO2: 29 mmol/L (ref 22–32)
Calcium: 8 mg/dL — ABNORMAL LOW (ref 8.9–10.3)
Chloride: 98 mmol/L (ref 98–111)
Creatinine, Ser: 0.47 mg/dL — ABNORMAL LOW (ref 0.61–1.24)
GFR, Estimated: >60 mL/min (ref >60)
Glucose, Bld: 113 mg/dL — ABNORMAL HIGH (ref 70–99)
Potassium: 4.7 mmol/L (ref 3.5–5.1)
Sodium: 136 mmol/L (ref 135–145)

## 2022-10-22 LAB — GLUCOSE, CAPILLARY
Glucose-Capillary: 106 mg/dL — ABNORMAL HIGH (ref 70–99)
Glucose-Capillary: 110 mg/dL — ABNORMAL HIGH (ref 70–99)
Glucose-Capillary: 114 mg/dL — ABNORMAL HIGH (ref 70–99)
Glucose-Capillary: 118 mg/dL — ABNORMAL HIGH (ref 70–99)
Glucose-Capillary: 124 mg/dL — ABNORMAL HIGH (ref 70–99)
Glucose-Capillary: 98 mg/dL (ref 70–99)

## 2022-10-22 LAB — CBC
HCT: 24.7 % — ABNORMAL LOW (ref 39.0–52.0)
Hemoglobin: 7.7 g/dL — ABNORMAL LOW (ref 13.0–17.0)
MCH: 29.3 pg (ref 26.0–34.0)
MCHC: 31.2 g/dL (ref 30.0–36.0)
MCV: 93.9 fL (ref 80.0–100.0)
Platelets: 668 10*3/uL — ABNORMAL HIGH (ref 150–400)
RBC: 2.63 MIL/uL — ABNORMAL LOW (ref 4.22–5.81)
RDW: 14 % (ref 11.5–15.5)
WBC: 15.9 10*3/uL — ABNORMAL HIGH (ref 4.0–10.5)
nRBC: 0 % (ref 0.0–0.2)

## 2022-10-22 LAB — CULTURE, RESPIRATORY W GRAM STAIN: Culture: NORMAL
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
## 2022-10-23 ENCOUNTER — Inpatient Hospital Stay (HOSPITAL_COMMUNITY): Payer: Medicaid Other

## 2022-10-23 LAB — BASIC METABOLIC PANEL
Anion gap: 7 (ref 5–15)
BUN: 14 mg/dL (ref 6–20)
CO2: 31 mmol/L (ref 22–32)
Calcium: 8.3 mg/dL — ABNORMAL LOW (ref 8.9–10.3)
Chloride: 97 mmol/L — ABNORMAL LOW (ref 98–111)
Creatinine, Ser: 0.47 mg/dL — ABNORMAL LOW (ref 0.61–1.24)
GFR, Estimated: >60 mL/min (ref >60)
Glucose, Bld: 102 mg/dL — ABNORMAL HIGH (ref 70–99)
Potassium: 4.6 mmol/L (ref 3.5–5.1)
Sodium: 135 mmol/L (ref 135–145)

## 2022-10-23 LAB — GLUCOSE, CAPILLARY
Glucose-Capillary: 91 mg/dL (ref 70–99)
Glucose-Capillary: 90 mg/dL (ref 70–99)
Glucose-Capillary: 132 mg/dL — ABNORMAL HIGH (ref 70–99)
Glucose-Capillary: 80 mg/dL (ref 70–99)
Glucose-Capillary: 101 mg/dL — ABNORMAL HIGH (ref 70–99)

## 2022-10-23 LAB — CBC
HCT: 24.5 % — ABNORMAL LOW (ref 39.0–52.0)
Hemoglobin: 7.7 g/dL — ABNORMAL LOW (ref 13.0–17.0)
MCH: 29.4 pg (ref 26.0–34.0)
MCHC: 31.4 g/dL (ref 30.0–36.0)
MCV: 93.5 fL (ref 80.0–100.0)
Platelets: 684 10*3/uL — ABNORMAL HIGH (ref 150–400)
RBC: 2.62 MIL/uL — ABNORMAL LOW (ref 4.22–5.81)
RDW: 13.8 % (ref 11.5–15.5)
WBC: 13.5 10*3/uL — ABNORMAL HIGH (ref 4.0–10.5)
nRBC: 0 % (ref 0.0–0.2)

## 2022-10-23 MED ORDER — ROCURONIUM BROMIDE 10 MG/ML (PF) SYRINGE
PREFILLED_SYRINGE | INTRAVENOUS | Status: AC
  Administered 2022-10-23: 100 mg via INTRAVENOUS
  Filled 2022-10-23: qty 20

## 2022-10-23 MED ORDER — JEVITY 1.5 CAL/FIBER PO LIQD
1000.0000 mL | ORAL | Status: DC
  Administered 2022-10-23 – 2022-10-27 (×5): 1000 mL
  Filled 2022-10-23 (×10): qty 1000

## 2022-10-23 MED ORDER — PROSOURCE TF20 ENFIT COMPATIBL EN LIQD
60.0000 mL | Freq: Two times a day (BID) | ENTERAL | Status: DC
  Administered 2022-10-23 – 2022-10-28 (×11): 60 mL
  Filled 2022-10-23 (×12): qty 60

## 2022-10-23 MED ORDER — MIDAZOLAM BOLUS VIA INFUSION: 0.0000 mg | INTRAVENOUS | Status: DC | PRN

## 2022-10-23 MED ORDER — ROCURONIUM BROMIDE 50 MG/5ML IV SOLN
200.0000 mg | Freq: Once | INTRAVENOUS | Status: AC
  Filled 2022-10-23: qty 20

## 2022-10-23 MED ORDER — MIDAZOLAM HCL 2 MG/2ML IJ SOLN
20.0000 mg | Freq: Once | INTRAMUSCULAR | Status: AC
  Administered 2022-10-23: 10 mg via INTRAVENOUS
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
## 2022-10-23 NOTE — Op Note (Signed)
   Operative Note  Date: 10/23/2022  Procedure: percutaneous tracheostomy without bronchoscopic assistance  Pre-op diagnosis: prolonged mechanical ventilation Post-op diagnosis: same  Indication and clinical history: The patient  is a 37 y.o. year old male with prolonged mechanical ventilation  Surgeon: Jesusita Oka, MD Assistant: Evette Cristal, PA  Findings:  Specimen: none EBL: <5cc  Drains/Implants: #6 Shiley cuffed  tracheostomy tube  Disposition: ICU  Description of procedure: The patient was positioned supine with a shoulder roll. Time-out was performed verifying correct patient, procedure, signature of informed consent, and pre-operative antibiotics as indicated. Anesthetic induction was uneventful and the patient was confirmed to be on 100% FiO2. The neck was prepped and draped in the usual sterile fashion. Palpation of neck anatomy was performed. A longitudinal incision was made in the neck and deepened down to the trachea.   The pilot balloon was deflated and the endotracheal tube slowly retracted proximally until the tip of the endotracheal tube was palpated just below the cricoid cartilage. An introducer needle was inserted between the second and third tracheal rings. A guidewire was passed through the introducer needle and the needle removed. Serial dilation was performed and a #6   cuffed Shiley and stylet were inserted over the guidewire and the guidewire and stylet removed. The inner cannula was inserted, the pilot balloon on the tracheostomy inflated, and the ventilator tubing disconnected from the endotracheal tube and connected to the tracheostomy. Chest rise, appropriate end-tidal CO2, and return tidal volumes were confirmed. The tracheostomy tube was sutured in four quadrants and a tracheostomy tie applied to the neck. The endotracheal tube was removed. The patient tolerated the procedure well. There were no complications. Post-procedure chest x-ray was ordered to confirm  tube position and the absence of a pneumothorax.   Jesusita Oka, MD General and [facility]

## 2022-10-24 DIAGNOSIS — Z9889 Other specified postprocedural states: Secondary | ICD-10-CM

## 2022-10-24 LAB — CBC
HCT: 24.4 % — ABNORMAL LOW (ref 39.0–52.0)
Hemoglobin: 7.6 g/dL — ABNORMAL LOW (ref 13.0–17.0)
MCH: 28.9 pg (ref 26.0–34.0)
MCHC: 31.1 g/dL (ref 30.0–36.0)
MCV: 92.8 fL (ref 80.0–100.0)
Platelets: 706 10*3/uL — ABNORMAL HIGH (ref 150–400)
RBC: 2.63 MIL/uL — ABNORMAL LOW (ref 4.22–5.81)
RDW: 13.6 % (ref 11.5–15.5)
WBC: 10.2 10*3/uL (ref 4.0–10.5)
nRBC: 0 % (ref 0.0–0.2)

## 2022-10-24 LAB — CULTURE, BLOOD (ROUTINE X 2)

## 2022-10-24 LAB — GLUCOSE, CAPILLARY
Glucose-Capillary: 106 mg/dL — ABNORMAL HIGH (ref 70–99)
Glucose-Capillary: 110 mg/dL — ABNORMAL HIGH (ref 70–99)
Glucose-Capillary: 111 mg/dL — ABNORMAL HIGH (ref 70–99)
Glucose-Capillary: 114 mg/dL — ABNORMAL HIGH (ref 70–99)
Glucose-Capillary: 119 mg/dL — ABNORMAL HIGH (ref 70–99)
Glucose-Capillary: 122 mg/dL — ABNORMAL HIGH (ref 70–99)
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
## 2022-10-24 NOTE — Progress Notes (Signed)
Inpatient Rehab Admissions Coordinator:   Per therapy recommendations pt was screened for CIR by Shann Medal, PT, DPT.  At this time noted pt still on the vent.  Will follow for participation with therapy once extubated.   Shann Medal, PT, DPT Admissions Coordinator [phone] 10/24/22  1:55 PM

## 2022-10-24 NOTE — Progress Notes (Signed)
RT NOTE:  Vent circuit changed by RT & RN due to secretions backed up in tubing. Pt stable throughout.

## 2022-10-24 NOTE — Progress Notes (Signed)
SLP Cancellation Note  Patient Details Name: Timothy Black MRN: [id number] DOB: 1986/02/14   Cancelled treatment:       Reason Eval/Treat Not Completed: Patient not medically ready. Remains on vent. Patient with new tracheostomy. Orders for SLP eval and treat for PMSV and swallowing received. Will follow pt closely for readiness for SLP interventions as appropriate.     Rylah Fukuda, Katherene Ponto 10/24/2022, 10:24 AM
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
## 2022-10-25 DIAGNOSIS — Z9889 Other specified postprocedural states: Secondary | ICD-10-CM | POA: Diagnosis not present

## 2022-10-25 LAB — BASIC METABOLIC PANEL
Anion gap: 6 (ref 5–15)
BUN: 16 mg/dL (ref 6–20)
CO2: 35 mmol/L — ABNORMAL HIGH (ref 22–32)
Calcium: 8.6 mg/dL — ABNORMAL LOW (ref 8.9–10.3)
Chloride: 96 mmol/L — ABNORMAL LOW (ref 98–111)
Creatinine, Ser: 0.49 mg/dL — ABNORMAL LOW (ref 0.61–1.24)
GFR, Estimated: >60 mL/min (ref >60)
Glucose, Bld: 118 mg/dL — ABNORMAL HIGH (ref 70–99)
Potassium: 4 mmol/L (ref 3.5–5.1)
Sodium: 137 mmol/L (ref 135–145)

## 2022-10-25 LAB — CBC
HCT: 24.6 % — ABNORMAL LOW (ref 39.0–52.0)
Hemoglobin: 7.8 g/dL — ABNORMAL LOW (ref 13.0–17.0)
MCH: 29.1 pg (ref 26.0–34.0)
MCHC: 31.7 g/dL (ref 30.0–36.0)
MCV: 91.8 fL (ref 80.0–100.0)
Platelets: 751 10*3/uL — ABNORMAL HIGH (ref 150–400)
RBC: 2.68 MIL/uL — ABNORMAL LOW (ref 4.22–5.81)
RDW: 13.3 % (ref 11.5–15.5)
WBC: 10.3 10*3/uL (ref 4.0–10.5)
nRBC: 0 % (ref 0.0–0.2)

## 2022-10-25 LAB — CULTURE, BLOOD (ROUTINE X 2): Culture: NO GROWTH

## 2022-10-25 LAB — GLUCOSE, CAPILLARY
Glucose-Capillary: 108 mg/dL — ABNORMAL HIGH (ref 70–99)
Glucose-Capillary: 109 mg/dL — ABNORMAL HIGH (ref 70–99)
Glucose-Capillary: 112 mg/dL — ABNORMAL HIGH (ref 70–99)
Glucose-Capillary: 133 mg/dL — ABNORMAL HIGH (ref 70–99)
Glucose-Capillary: 136 mg/dL — ABNORMAL HIGH (ref 70–99)

## 2022-10-25 MED ORDER — MIDAZOLAM HCL 2 MG/2ML IJ SOLN
2.0000 mg | INTRAMUSCULAR | Status: DC | PRN
  Administered 2022-10-25 – 2022-10-26 (×5): 4 mg via INTRAVENOUS
  Filled 2022-10-25 (×5): qty 4

## 2022-10-25 MED ORDER — GUAIFENESIN 100 MG/5ML PO LIQD
15.0000 mL | ORAL | Status: DC
  Administered 2022-10-25 – 2022-10-28 (×21): 15 mL
  Filled 2022-10-25 (×21): qty 15

## 2022-10-25 MED ORDER — METHADONE HCL 10 MG PO TABS: 10.0000 mg | ORAL_TABLET | Freq: Three times a day (TID) | ORAL | Status: DC

## 2022-10-25 MED ORDER — METHADONE HCL 10 MG PO TABS
10.0000 mg | ORAL_TABLET | Freq: Three times a day (TID) | ORAL | Status: DC
  Administered 2022-10-25 – 2022-10-26 (×3): 10 mg
  Filled 2022-10-25 (×3): qty 1

## 2022-10-25 MED ORDER — QUETIAPINE FUMARATE 100 MG PO TABS
100.0000 mg | ORAL_TABLET | Freq: Two times a day (BID) | ORAL | Status: DC
  Administered 2022-10-25 – 2022-10-28 (×6): 100 mg
  Filled 2022-10-25 (×6): qty 1

## 2022-10-25 MED ORDER — DIAZEPAM 5 MG PO TABS
15.0000 mg | ORAL_TABLET | Freq: Four times a day (QID) | ORAL | Status: DC
  Administered 2022-10-25 – 2022-10-28 (×12): 15 mg
  Filled 2022-10-25 (×12): qty 3
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
## 2022-10-25 NOTE — Progress Notes (Signed)
RT placed patient on ATC 12L/40% at this time. Pt is tolerating settings well. Vent on standby at bedside.

## 2022-10-25 NOTE — Progress Notes (Signed)
Patient requested RN to hold his tube feeds because they were "messing with his stomach". RN put tube feeds on hold and made MD aware. Will continue to monitor.
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
## 2022-10-26 ENCOUNTER — Inpatient Hospital Stay (HOSPITAL_COMMUNITY): Payer: Medicaid Other

## 2022-10-26 LAB — CBC
HCT: 26.7 % — ABNORMAL LOW (ref 39.0–52.0)
Hemoglobin: 8.3 g/dL — ABNORMAL LOW (ref 13.0–17.0)
MCH: 28.8 pg (ref 26.0–34.0)
MCHC: 31.1 g/dL (ref 30.0–36.0)
MCV: 92.7 fL (ref 80.0–100.0)
Platelets: 791 10*3/uL — ABNORMAL HIGH (ref 150–400)
RBC: 2.88 MIL/uL — ABNORMAL LOW (ref 4.22–5.81)
RDW: 13.2 % (ref 11.5–15.5)
WBC: 15.2 10*3/uL — ABNORMAL HIGH (ref 4.0–10.5)
nRBC: 0 % (ref 0.0–0.2)

## 2022-10-26 LAB — BASIC METABOLIC PANEL
Anion gap: 10 (ref 5–15)
BUN: 17 mg/dL (ref 6–20)
CO2: 29 mmol/L (ref 22–32)
Calcium: 8.9 mg/dL (ref 8.9–10.3)
Chloride: 95 mmol/L — ABNORMAL LOW (ref 98–111)
Creatinine, Ser: 0.49 mg/dL — ABNORMAL LOW (ref 0.61–1.24)
GFR, Estimated: >60 mL/min (ref >60)
Glucose, Bld: 150 mg/dL — ABNORMAL HIGH (ref 70–99)
Potassium: 4.3 mmol/L (ref 3.5–5.1)
Sodium: 134 mmol/L — ABNORMAL LOW (ref 135–145)

## 2022-10-26 LAB — GLUCOSE, CAPILLARY
Glucose-Capillary: 122 mg/dL — ABNORMAL HIGH (ref 70–99)
Glucose-Capillary: 112 mg/dL — ABNORMAL HIGH (ref 70–99)
Glucose-Capillary: 111 mg/dL — ABNORMAL HIGH (ref 70–99)
Glucose-Capillary: 134 mg/dL — ABNORMAL HIGH (ref 70–99)

## 2022-10-26 MED ORDER — METHADONE HCL 10 MG PO TABS
15.0000 mg | ORAL_TABLET | Freq: Three times a day (TID) | ORAL | Status: DC
  Administered 2022-10-26 – 2022-10-27 (×3): 15 mg
  Filled 2022-10-26 (×3): qty 2

## 2022-10-26 MED ORDER — QUETIAPINE FUMARATE 200 MG PO TABS
200.0000 mg | ORAL_TABLET | Freq: Every day | ORAL | Status: DC
  Administered 2022-10-27: 200 mg
  Filled 2022-10-26: qty 1

## 2022-10-26 MED ORDER — DEXMEDETOMIDINE HCL IN NACL 400 MCG/100ML IV SOLN
0.0000 ug/kg/h | INTRAVENOUS | Status: DC
  Administered 2022-10-26: 0.4 ug/kg/h via INTRAVENOUS
  Administered 2022-10-26: 1.1 ug/kg/h via INTRAVENOUS
  Administered 2022-10-26: 1.2 ug/kg/h via INTRAVENOUS
  Administered 2022-10-26: 1.1 ug/kg/h via INTRAVENOUS
  Administered 2022-10-27 (×2): 0.7 ug/kg/h via INTRAVENOUS
  Administered 2022-10-27: 1.1 ug/kg/h via INTRAVENOUS
  Administered 2022-10-27: 0.7 ug/kg/h via INTRAVENOUS
  Administered 2022-10-28: 0.3 ug/kg/h via INTRAVENOUS
  Administered 2022-10-28 – 2022-10-29 (×2): 0.6 ug/kg/h via INTRAVENOUS
  Filled 2022-10-26 (×12): qty 100

## 2022-10-26 MED ORDER — OXYCODONE HCL 5 MG PO TABS
15.0000 mg | ORAL_TABLET | ORAL | Status: DC
  Administered 2022-10-26 – 2022-10-27 (×7): 15 mg
  Filled 2022-10-26 (×7): qty 3

## 2022-10-26 MED ORDER — ACETAMINOPHEN 160 MG/5ML PO SOLN
1000.0000 mg | Freq: Four times a day (QID) | ORAL | Status: DC
  Administered 2022-10-26 – 2022-10-28 (×8): 1000 mg
  Filled 2022-10-26 (×8): qty 40.6

## 2022-10-26 NOTE — Progress Notes (Signed)
Patient tachycardic, tachypnic, diaphoretic, agitated, trying to climb out of bed despite max use of PRN's and scheduled meds.  Patient states he is withdrawing and in 10/10 pain.  Dr Zenia Resides paged and order received to start precedex at low dose.  Precedex started and patient still restless but much less agitated and vitals improved.  Will continue to monitor and titrate accordingly.
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
## 2022-10-26 NOTE — Procedures (Signed)
Insertion of Chest Tube Procedure Note  Timothy Black  [id number]  Jan 14, 1986  Date:10/26/22  Time:9:17 AM    Provider Performing: Zenovia Jarred   Procedure: Chest Tube Insertion (83094)  Indication(s) R tension pneumothorax  Consent Unable to obtain consent due to emergent nature of procedure.  Anesthesia Topical only with 1% lidocaine  IV pain med  Time Out Verified patient identification, verified procedure, site/side was marked, verified correct patient position, special equipment/implants available, medications/allergies/relevant history reviewed, required imaging and test results available.   Sterile Technique Maximal sterile technique including full sterile barrier drape, hand hygiene, sterile gown, sterile gloves, mask, hair covering, sterile ultrasound probe cover (if used).   Procedure Description Ultrasound not used to identify appropriate pleural anatomy for placement and overlying skin marked. Area of placement cleaned and draped in sterile fashion.  A 14 French pigtail pleural catheter was placed into the right pleural space using Seldinger technique. Appropriate return of air was obtained.  The tube was connected to atrium and placed on -20 cm H2O wall suction.   Complications/Tolerance None; patient tolerated the procedure well. Chest X-ray is ordered to verify placement.   EBL Minimal  Specimen(s) noneProcedures  [name], MD, MPH, FACS Please use [email or web address] to contact on call provider

## 2022-10-26 NOTE — Progress Notes (Signed)
RT had to place patient back on Vent due too desaturation and increased WOB. RT attempted to place on PSV first but patient remained very tachypneic in the 40s and accessory muscle use. RT placed patient back on full support. Patient is tolerating well at this time.

## 2022-10-26 NOTE — Progress Notes (Signed)
RT NOTE: RT trialed patient on 70% ATC. Patient was able to tolerate for about 15 minutes, then sats dropped into the 80's and patient's WOB increased considerably with HR in the 130-140 range. Patient placed back on ventilator with full support with sats improving to 98% and HR coming down. VSS. RT will continue to monitor.

## 2022-10-27 ENCOUNTER — Inpatient Hospital Stay (HOSPITAL_COMMUNITY): Payer: Medicaid Other

## 2022-10-27 LAB — BASIC METABOLIC PANEL
Anion gap: 9 (ref 5–15)
BUN: 29 mg/dL — ABNORMAL HIGH (ref 6–20)
CO2: 28 mmol/L (ref 22–32)
Calcium: 8.4 mg/dL — ABNORMAL LOW (ref 8.9–10.3)
Chloride: 100 mmol/L (ref 98–111)
Creatinine, Ser: 0.55 mg/dL — ABNORMAL LOW (ref 0.61–1.24)
GFR, Estimated: >60 mL/min (ref >60)
Glucose, Bld: 106 mg/dL — ABNORMAL HIGH (ref 70–99)
Potassium: 4.2 mmol/L (ref 3.5–5.1)
Sodium: 137 mmol/L (ref 135–145)

## 2022-10-27 LAB — GLUCOSE, CAPILLARY
Glucose-Capillary: 110 mg/dL — ABNORMAL HIGH (ref 70–99)
Glucose-Capillary: 123 mg/dL — ABNORMAL HIGH (ref 70–99)
Glucose-Capillary: 125 mg/dL — ABNORMAL HIGH (ref 70–99)
Glucose-Capillary: 128 mg/dL — ABNORMAL HIGH (ref 70–99)
Glucose-Capillary: 140 mg/dL — ABNORMAL HIGH (ref 70–99)

## 2022-10-27 LAB — CBC
HCT: 24.7 % — ABNORMAL LOW (ref 39.0–52.0)
Hemoglobin: 7.8 g/dL — ABNORMAL LOW (ref 13.0–17.0)
MCH: 28.9 pg (ref 26.0–34.0)
MCHC: 31.6 g/dL (ref 30.0–36.0)
MCV: 91.5 fL (ref 80.0–100.0)
Platelets: 702 10*3/uL — ABNORMAL HIGH (ref 150–400)
RBC: 2.7 MIL/uL — ABNORMAL LOW (ref 4.22–5.81)
RDW: 13.3 % (ref 11.5–15.5)
WBC: 11.6 10*3/uL — ABNORMAL HIGH (ref 4.0–10.5)
nRBC: 0 % (ref 0.0–0.2)

## 2022-10-27 MED ORDER — METHADONE HCL 10 MG PO TABS
20.0000 mg | ORAL_TABLET | Freq: Three times a day (TID) | ORAL | Status: DC
  Administered 2022-10-27 – 2022-10-28 (×4): 20 mg
  Filled 2022-10-27 (×4): qty 2

## 2022-10-27 MED ORDER — ORAL CARE MOUTH RINSE: 15.0000 mL | OROMUCOSAL | Status: DC | PRN

## 2022-10-27 MED ORDER — ORAL CARE MOUTH RINSE
15.0000 mL | OROMUCOSAL | Status: DC
  Administered 2022-10-27 – 2022-11-05 (×29): 15 mL via OROMUCOSAL

## 2022-10-27 MED ORDER — OXYCODONE HCL 5 MG PO TABS
10.0000 mg | ORAL_TABLET | ORAL | Status: DC
  Administered 2022-10-27 – 2022-10-28 (×5): 10 mg
  Filled 2022-10-27 (×5): qty 2
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
## 2022-10-27 NOTE — Progress Notes (Signed)
RT NOTE: PT placed on 40% ATC. Tolerating well at this time with sats of 100% and no increased WOB. VSS. RT will continue to monitor.

## 2022-10-28 ENCOUNTER — Inpatient Hospital Stay (HOSPITAL_COMMUNITY): Payer: Medicaid Other

## 2022-10-28 DIAGNOSIS — Z9889 Other specified postprocedural states: Secondary | ICD-10-CM | POA: Diagnosis not present

## 2022-10-28 LAB — CBC
HCT: 24.4 % — ABNORMAL LOW (ref 39.0–52.0)
Hemoglobin: 7.7 g/dL — ABNORMAL LOW (ref 13.0–17.0)
MCH: 28.9 pg (ref 26.0–34.0)
MCHC: 31.6 g/dL (ref 30.0–36.0)
MCV: 91.7 fL (ref 80.0–100.0)
Platelets: 610 10*3/uL — ABNORMAL HIGH (ref 150–400)
RBC: 2.66 MIL/uL — ABNORMAL LOW (ref 4.22–5.81)
RDW: 13.5 % (ref 11.5–15.5)
WBC: 10.1 10*3/uL (ref 4.0–10.5)
nRBC: 0 % (ref 0.0–0.2)

## 2022-10-28 LAB — BASIC METABOLIC PANEL
Anion gap: 10 (ref 5–15)
BUN: 18 mg/dL (ref 6–20)
CO2: 28 mmol/L (ref 22–32)
Calcium: 8.6 mg/dL — ABNORMAL LOW (ref 8.9–10.3)
Chloride: 96 mmol/L — ABNORMAL LOW (ref 98–111)
Creatinine, Ser: 0.55 mg/dL — ABNORMAL LOW (ref 0.61–1.24)
GFR, Estimated: >60 mL/min (ref >60)
Glucose, Bld: 125 mg/dL — ABNORMAL HIGH (ref 70–99)
Potassium: 3.9 mmol/L (ref 3.5–5.1)
Sodium: 134 mmol/L — ABNORMAL LOW (ref 135–145)

## 2022-10-28 LAB — GLUCOSE, CAPILLARY
Glucose-Capillary: 118 mg/dL — ABNORMAL HIGH (ref 70–99)
Glucose-Capillary: 51 mg/dL — ABNORMAL LOW (ref 70–99)

## 2022-10-28 MED ORDER — ACETAMINOPHEN 160 MG/5ML PO SOLN
1000.0000 mg | Freq: Four times a day (QID) | ORAL | Status: DC
  Administered 2022-10-29 – 2022-10-30 (×6): 1000 mg via ORAL
  Filled 2022-10-28 (×7): qty 40.6

## 2022-10-28 MED ORDER — QUETIAPINE FUMARATE 100 MG PO TABS
200.0000 mg | ORAL_TABLET | Freq: Every day | ORAL | Status: DC
  Administered 2022-10-28 – 2022-10-29 (×2): 200 mg via ORAL
  Filled 2022-10-28: qty 2
  Filled 2022-10-28: qty 1

## 2022-10-28 MED ORDER — DIAZEPAM 5 MG PO TABS
10.0000 mg | ORAL_TABLET | Freq: Four times a day (QID) | ORAL | Status: DC
  Administered 2022-10-28: 10 mg
  Filled 2022-10-28: qty 2

## 2022-10-28 MED ORDER — QUETIAPINE FUMARATE 50 MG PO TABS
50.0000 mg | ORAL_TABLET | Freq: Two times a day (BID) | ORAL | Status: DC
  Administered 2022-10-29 – 2022-11-04 (×13): 50 mg via ORAL
  Filled 2022-10-28 (×9): qty 1
  Filled 2022-10-28: qty 2
  Filled 2022-10-28 (×3): qty 1

## 2022-10-28 MED ORDER — GUAIFENESIN 100 MG/5ML PO LIQD
15.0000 mL | ORAL | Status: DC
  Administered 2022-10-28 – 2022-11-02 (×26): 15 mL via ORAL
  Filled 2022-10-28: qty 15
  Filled 2022-10-28 (×4): qty 20
  Filled 2022-10-28: qty 15
  Filled 2022-10-28 (×4): qty 20
  Filled 2022-10-28 (×2): qty 15
  Filled 2022-10-28 (×12): qty 20
  Filled 2022-10-28: qty 15
  Filled 2022-10-28 (×2): qty 20

## 2022-10-28 MED ORDER — OXYCODONE HCL 5 MG PO TABS
10.0000 mg | ORAL_TABLET | Freq: Four times a day (QID) | ORAL | Status: DC
  Administered 2022-10-28 (×2): 10 mg
  Filled 2022-10-28 (×2): qty 2

## 2022-10-28 MED ORDER — DIAZEPAM 5 MG PO TABS
10.0000 mg | ORAL_TABLET | Freq: Four times a day (QID) | ORAL | Status: DC
  Administered 2022-10-28 – 2022-10-30 (×7): 10 mg via ORAL
  Filled 2022-10-28 (×7): qty 2

## 2022-10-28 MED ORDER — QUETIAPINE FUMARATE 25 MG PO TABS
50.0000 mg | ORAL_TABLET | Freq: Two times a day (BID) | ORAL | Status: DC
  Administered 2022-10-28: 50 mg
  Filled 2022-10-28: qty 2

## 2022-10-28 MED ORDER — DOCUSATE SODIUM 50 MG/5ML PO LIQD
100.0000 mg | Freq: Two times a day (BID) | ORAL | Status: DC
  Administered 2022-10-28 – 2022-10-29 (×2): 100 mg via ORAL
  Filled 2022-10-28 (×4): qty 10

## 2022-10-28 MED ORDER — OXYCODONE HCL 5 MG PO TABS
10.0000 mg | ORAL_TABLET | Freq: Four times a day (QID) | ORAL | Status: DC
  Administered 2022-10-28 – 2022-11-05 (×28): 10 mg via ORAL
  Filled 2022-10-28 (×31): qty 2

## 2022-10-28 MED ORDER — METHADONE HCL 10 MG PO TABS
20.0000 mg | ORAL_TABLET | Freq: Three times a day (TID) | ORAL | Status: DC
  Administered 2022-10-28 – 2022-11-01 (×11): 20 mg via ORAL
  Filled 2022-10-28 (×11): qty 2

## 2022-10-28 MED ORDER — OXYCODONE HCL 5 MG PO TABS
10.0000 mg | ORAL_TABLET | ORAL | Status: DC | PRN
  Administered 2022-10-28 – 2022-10-29 (×3): 15 mg via ORAL
  Administered 2022-10-29: 10 mg via ORAL
  Administered 2022-10-29 – 2022-11-01 (×6): 15 mg via ORAL
  Administered 2022-11-02 (×2): 10 mg via ORAL
  Administered 2022-11-04: 15 mg via ORAL
  Filled 2022-10-28 (×7): qty 3
  Filled 2022-10-28: qty 2
  Filled 2022-10-28 (×3): qty 3
  Filled 2022-10-28: qty 2
  Filled 2022-10-28: qty 3
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
## 2022-10-28 NOTE — TOC CAGE-AID Note (Signed)
Transition of Care [facility]) - CAGE-AID Screening   Patient Details  Name: Timothy Black MRN: [id number] Date of Birth: Oct 11, 1985  Transition of Care [facility] CM/SW Contact:    Jinger Neighbors, LCSW Phone Number: 10/28/2022, 11:55 AM   Clinical Narrative:  Csw met with pt at bedside to complete CAGE assessment; assessment complete.   CAGE-AID Screening:    Have You Ever Felt You Ought to Cut Down on Your Drinking or Drug Use?: Yes Have People Annoyed You By Critizing Your Drinking Or Drug Use?: Yes Have You Felt Bad Or Guilty About Your Drinking Or Drug Use?: Yes Have You Ever Had a Drink or Used Drugs First Thing In The Morning to Steady Your Nerves or to Get Rid of a Hangover?: Yes CAGE-AID Score: 4  Substance Abuse Education Offered: Yes  Substance abuse interventions: Patient Counseling
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
## 2022-10-28 NOTE — Progress Notes (Signed)
Modified Barium Swallow Progress Note  Patient Details  Name: Zachari Alberta MRN: [id number] Date of Birth: 03/01/1986  Today's Date: 10/28/2022  Modified Barium Swallow completed.  Full report located under Chart Review in the Imaging Section.  Brief recommendations include the following:  Clinical Impression  Pt demonstrates no swallowing impairments. Pt has intermittent coughing and wet vocal quality not associated with any aspiration or penetration; just secretion management. Pt has PMSV in place during assessment and will need to wear PMSV while eating and drinking into the future. No SLP f/u needed for swallowing   Swallow Evaluation Recommendations       SLP Diet Recommendations: Regular solids;Thin liquid   Liquid Administration via: Cup;Straw   Medication Administration: Whole meds with liquid   Supervision: Patient able to self feed                    Horace Lukas, Katherene Ponto 10/28/2022,1:59 PM

## 2022-10-28 NOTE — Progress Notes (Signed)
Inpatient Rehab Admissions Coordinator:   Per therapy recommendations,  patient was screened for CIR candidacy by Clemens Catholic, MS, CCC-SLP   At this time, Pt. is not medically ready for CIR (not off vent for over 24 hrs). I will not pursue a rehab consult for this Pt. at this time, but CIR admissions team will follow and monitor for medical readiness and place consult order if Pt. appears to be an appropriate candidate. Please contact me with any questions.   Clemens Catholic, [place], [place] Admissions Coordinator  [phone] ([place]) [phone] (office)
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
## 2022-10-28 NOTE — Progress Notes (Signed)
PT Cancellation Note  Patient Details Name: Dayne Chait MRN: [id number] DOB: 1986/02/17   Cancelled Treatment:    Reason Eval/Treat Not Completed: Patient declined, no reason specified.  Coming off Blood glucose of 51, now up at appropriate levels, but pt declines getting OOB. 10/28/2022  [name], PT Acute Rehabilitation Services [phone]  (office)   [name] Dominik Lauricella 10/28/2022, 6:03 PM
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
## 2022-10-29 LAB — BASIC METABOLIC PANEL
Anion gap: 8 (ref 5–15)
BUN: 13 mg/dL (ref 6–20)
CO2: 28 mmol/L (ref 22–32)
Calcium: 8.5 mg/dL — ABNORMAL LOW (ref 8.9–10.3)
Chloride: 94 mmol/L — ABNORMAL LOW (ref 98–111)
Creatinine, Ser: 0.5 mg/dL — ABNORMAL LOW (ref 0.61–1.24)
GFR, Estimated: >60 mL/min (ref >60)
Glucose, Bld: 104 mg/dL — ABNORMAL HIGH (ref 70–99)
Potassium: 4.2 mmol/L (ref 3.5–5.1)
Sodium: 130 mmol/L — ABNORMAL LOW (ref 135–145)

## 2022-10-29 LAB — CBC
HCT: 27.3 % — ABNORMAL LOW (ref 39.0–52.0)
Hemoglobin: 8.6 g/dL — ABNORMAL LOW (ref 13.0–17.0)
MCH: 28.3 pg (ref 26.0–34.0)
MCHC: 31.5 g/dL (ref 30.0–36.0)
MCV: 89.8 fL (ref 80.0–100.0)
Platelets: 576 10*3/uL — ABNORMAL HIGH (ref 150–400)
RBC: 3.04 MIL/uL — ABNORMAL LOW (ref 4.22–5.81)
RDW: 13.3 % (ref 11.5–15.5)
WBC: 8.8 10*3/uL (ref 4.0–10.5)
nRBC: 0 % (ref 0.0–0.2)

## 2022-10-29 MED ORDER — PANTOPRAZOLE SODIUM 40 MG PO TBEC
40.0000 mg | DELAYED_RELEASE_TABLET | Freq: Every day | ORAL | Status: DC
  Administered 2022-10-29 – 2022-11-04 (×7): 40 mg via ORAL
  Filled 2022-10-29 (×7): qty 1

## 2022-10-29 MED ORDER — JUVEN PO PACK
1.0000 | PACK | Freq: Two times a day (BID) | ORAL | Status: DC
  Administered 2022-10-30 – 2022-11-04 (×7): 1 via ORAL
  Filled 2022-10-29 (×10): qty 1
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
## 2022-10-30 ENCOUNTER — Inpatient Hospital Stay (HOSPITAL_COMMUNITY): Payer: Medicaid Other

## 2022-10-30 DIAGNOSIS — Z9889 Other specified postprocedural states: Secondary | ICD-10-CM | POA: Diagnosis not present

## 2022-10-30 LAB — BASIC METABOLIC PANEL
Anion gap: 11 (ref 5–15)
BUN: 9 mg/dL (ref 6–20)
CO2: 27 mmol/L (ref 22–32)
Calcium: 8.7 mg/dL — ABNORMAL LOW (ref 8.9–10.3)
Chloride: 93 mmol/L — ABNORMAL LOW (ref 98–111)
Creatinine, Ser: 0.52 mg/dL — ABNORMAL LOW (ref 0.61–1.24)
GFR, Estimated: >60 mL/min (ref >60)
Glucose, Bld: 99 mg/dL (ref 70–99)
Potassium: 4.2 mmol/L (ref 3.5–5.1)
Sodium: 131 mmol/L — ABNORMAL LOW (ref 135–145)

## 2022-10-30 LAB — CBC
HCT: 26.7 % — ABNORMAL LOW (ref 39.0–52.0)
Hemoglobin: 8.6 g/dL — ABNORMAL LOW (ref 13.0–17.0)
MCH: 28.7 pg (ref 26.0–34.0)
MCHC: 32.2 g/dL (ref 30.0–36.0)
MCV: 89 fL (ref 80.0–100.0)
Platelets: 543 10*3/uL — ABNORMAL HIGH (ref 150–400)
RBC: 3 MIL/uL — ABNORMAL LOW (ref 4.22–5.81)
RDW: 13.3 % (ref 11.5–15.5)
WBC: 9.3 10*3/uL (ref 4.0–10.5)
nRBC: 0 % (ref 0.0–0.2)

## 2022-10-30 MED ORDER — DOCUSATE SODIUM 100 MG PO CAPS
100.0000 mg | ORAL_CAPSULE | Freq: Two times a day (BID) | ORAL | Status: DC
  Administered 2022-10-30 – 2022-11-04 (×11): 100 mg via ORAL
  Filled 2022-10-30 (×12): qty 1

## 2022-10-30 MED ORDER — HYDROMORPHONE HCL 1 MG/ML IJ SOLN: 1.0000 mg | Freq: Four times a day (QID) | INTRAMUSCULAR | Status: DC | PRN

## 2022-10-30 MED ORDER — HYDROMORPHONE HCL 1 MG/ML IJ SOLN: 1.0000 mg | INTRAMUSCULAR | Status: DC | PRN

## 2022-10-30 MED ORDER — ACETAMINOPHEN 500 MG PO TABS
1000.0000 mg | ORAL_TABLET | Freq: Four times a day (QID) | ORAL | Status: DC
  Administered 2022-10-30 – 2022-11-05 (×22): 1000 mg via ORAL
  Filled 2022-10-30 (×24): qty 2

## 2022-10-30 MED ORDER — DIAZEPAM 5 MG PO TABS
7.5000 mg | ORAL_TABLET | Freq: Four times a day (QID) | ORAL | Status: DC
  Administered 2022-10-30 – 2022-11-01 (×7): 7.5 mg via ORAL
  Filled 2022-10-30 (×7): qty 2

## 2022-10-30 MED ORDER — HYDROMORPHONE HCL 1 MG/ML IJ SOLN
0.5000 mg | INTRAMUSCULAR | Status: DC | PRN
  Administered 2022-10-30 – 2022-11-01 (×13): 0.5 mg via INTRAVENOUS
  Filled 2022-10-30 (×12): qty 0.5

## 2022-10-30 MED ORDER — QUETIAPINE FUMARATE 100 MG PO TABS
100.0000 mg | ORAL_TABLET | Freq: Every day | ORAL | Status: DC
  Administered 2022-10-30 – 2022-11-04 (×6): 100 mg via ORAL
  Filled 2022-10-30 (×6): qty 1

## 2022-10-30 NOTE — Progress Notes (Signed)
Chaplain responded to spiritual care consult. Upon arrival, pt was walking out of his room with the PT. Chaplain provided a brief introduction and informed pt that we would try again. Chaplain also informed pt he is welcome to request the chaplain be paged if he requires a visit before we stop by.  Please page as further needs arise.  Donald Prose. Elyn Peers, M.Div. [facility] Chaplain Pager [phone] Office [phone]

## 2022-10-30 NOTE — Progress Notes (Signed)
Inpatient Rehabilitation Admissions Coordinator   I will place rehab consult for full assessment of rehab venue options.  Danne Baxter, RN, MSN Rehab Admissions Coordinator [phone] 10/30/2022 10:28 AM
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
## 2022-10-31 ENCOUNTER — Inpatient Hospital Stay (HOSPITAL_COMMUNITY): Payer: Medicaid Other

## 2022-10-31 NOTE — Progress Notes (Signed)
   10/31/22 1146  Spiritual Encounters  Type of Visit Attempt (pt unavailable)  Reason for visit Routine spiritual support   Attempted to visit pt but he was on a phone call.  Will check back later
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
## 2022-10-31 NOTE — Progress Notes (Signed)
PT Cancellation Note  Patient Details Name: Timothy Black MRN: [id number] DOB: January 10, 1986   Cancelled Treatment:    Reason Eval/Treat Not Completed: Patient declined, no reason specified.  Pt's sister died overnight and pt not wanting to participate. 10/31/2022  [name], PT Acute Rehabilitation Services [phone]  (office)   [name] Meilah Delrosario 10/31/2022, 12:29 PM

## 2022-10-31 NOTE — Progress Notes (Signed)
   10/31/22 1430  Spiritual Encounters  Type of Visit Initial  Care provided to: Patient  Referral source Patient request  Reason for visit Urgent spiritual support  OnCall Visit No  Spiritual Framework  Presenting Themes Significant life change;Values and beliefs  Patient Stress Factors Family relationships (recent close loses and drug addiction)  Family Stress Factors Family relationships  Interventions  Spiritual Care Interventions Made Compassionate presence;Reflective listening;Prayer  Intervention Outcomes  Outcomes Connection to spiritual care;Awareness of support;Connected to [place] will continue to follow   Pt. Requested to see chaplain "so he could give his testimony." He is at a crossroads in life and determined to make a meaningful change, hoping that faith will be part of that transformation.  He spoke of going to church with his uncle when he gets out.  He did mention homelessness and addiction as significant obstacles to be overcome.

## 2022-10-31 NOTE — Progress Notes (Signed)
OT Cancellation Note  Patient Details Name: Timothy Black MRN: [id number] DOB: 25-Dec-1985   Cancelled Treatment:    Reason Eval/Treat Not Completed: Other (comment) (Pt requesting to not participate today due to the loss of his sister. Will follow up tomorrow as time permits.)  [facility] 10/31/2022, 4:02 PM Maurie Boettcher, OT/L   Acute OT Clinical Specialist Acute Rehabilitation Services Pager [phone] Office [phone]
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
## 2022-11-01 ENCOUNTER — Telehealth (HOSPITAL_COMMUNITY): Payer: Self-pay | Admitting: Pharmacy Technician

## 2022-11-01 ENCOUNTER — Other Ambulatory Visit (HOSPITAL_COMMUNITY): Payer: Self-pay

## 2022-11-01 ENCOUNTER — Inpatient Hospital Stay (HOSPITAL_COMMUNITY): Payer: Medicaid Other

## 2022-11-01 DIAGNOSIS — W3400XA Accidental discharge from unspecified firearms or gun, initial encounter: Secondary | ICD-10-CM | POA: Diagnosis not present

## 2022-11-01 DIAGNOSIS — Z9889 Other specified postprocedural states: Secondary | ICD-10-CM | POA: Diagnosis not present

## 2022-11-01 MED ORDER — METHADONE HCL 10 MG PO TABS
20.0000 mg | ORAL_TABLET | Freq: Three times a day (TID) | ORAL | 0 refills | Status: DC
  Filled 2022-11-01 (×2): qty 18, 3d supply, fill #0

## 2022-11-01 MED ORDER — DIAZEPAM 5 MG PO TABS
5.0000 mg | ORAL_TABLET | Freq: Four times a day (QID) | ORAL | Status: DC
  Administered 2022-11-01 – 2022-11-04 (×13): 5 mg via ORAL
  Filled 2022-11-01 (×13): qty 1

## 2022-11-01 MED ORDER — HYDROMORPHONE HCL 1 MG/ML IJ SOLN
0.5000 mg | Freq: Once | INTRAMUSCULAR | Status: AC
  Administered 2022-11-01: 0.5 mg via INTRAVENOUS
  Filled 2022-11-01: qty 0.5

## 2022-11-01 MED ORDER — HYDROMORPHONE HCL 1 MG/ML IJ SOLN
0.2500 mg | Freq: Four times a day (QID) | INTRAMUSCULAR | Status: DC | PRN
  Administered 2022-11-02 – 2022-11-04 (×6): 0.25 mg via INTRAVENOUS
  Filled 2022-11-01 (×7): qty 0.5

## 2022-11-01 MED ORDER — METHADONE HCL 10 MG PO TABS
30.0000 mg | ORAL_TABLET | Freq: Three times a day (TID) | ORAL | Status: DC
  Administered 2022-11-01 – 2022-11-05 (×12): 30 mg via ORAL
  Filled 2022-11-01 (×12): qty 3

## 2022-11-01 MED ORDER — HYDROMORPHONE HCL 1 MG/ML IJ SOLN
0.5000 mg | INTRAMUSCULAR | Status: DC | PRN
  Administered 2022-11-01: 0.5 mg via INTRAVENOUS
  Filled 2022-11-01 (×2): qty 0.5

## 2022-11-01 NOTE — Procedures (Signed)
Tracheostomy Change Note  Patient Details:   Name: Timothy Black DOB: 11/16/1985 MRN: [id number]    Airway Documentation:     Evaluation  O2 sats: stable throughout Complications: No apparent complications Patient did tolerate procedure well. Bilateral Breath Sounds: Clear, Diminished    Pt trach changed from a shiley flex 6 cuffed to a shiley flex 6 uncuffed with RT x2 at bedside per order.  Cathie Olden 11/01/2022, 2:55 PM

## 2022-11-01 NOTE — Telephone Encounter (Signed)
Patient Advocate Encounter  Received notification that the request for prior authorization for Methadone HCl 10MG  tablets  has been denied       Lyndel Safe, [place] Patient Advocate Specialist [place] Patient Advocate Team Direct Number: [phone]  Fax: [phone]

## 2022-11-01 NOTE — Telephone Encounter (Signed)
Patient Advocate Encounter   Received notification that prior authorization for Methadone HCl 10MG  tablets is required.   PA submitted on 11/01/2022 Key [id number] Status is pending       Lyndel Safe, [place] Patient Advocate Specialist [place] Patient Advocate Team Direct Number: [phone]  Fax: [phone]
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
## 2022-11-01 NOTE — Progress Notes (Signed)
PT Cancellation Note  Patient Details Name: Timothy Black MRN: [id number] DOB: May 06, 1986   Cancelled Treatment:    Reason Eval/Treat Not Completed: Pain limiting ability to participate; patient reports "had an episode" and now too SOB and in pain to participate.  Spoke with RN who confirms pt with increased pain as was active earlier showering and had been trying to wean meds as pt trying to leave.  PT will attempt another day.    Reginia Naas 11/01/2022, 2:27 PM Magda Kiel, PT Acute Rehabilitation Services Office:[phone] 11/01/2022
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
## 2022-11-01 NOTE — Progress Notes (Signed)
OT Cancellation Note  Patient Details Name: Gilverto Dileonardo MRN: [id number] DOB: 14-Apr-1986   Cancelled Treatment:    Reason Eval/Treat Not Completed: Fatigue/lethargy limiting ability to participate;Other (comment) (increased pain).  Pt with too much pain and reports being too fatigued from shower and dressing this am as well as changing of the trach.  Will re-attempt Monday 2/5.  Jenella Craigie OTR/L 11/01/2022, 4:01 PM

## 2022-11-02 DIAGNOSIS — Z9889 Other specified postprocedural states: Secondary | ICD-10-CM | POA: Diagnosis not present

## 2022-11-02 DIAGNOSIS — Z046 Encounter for general psychiatric examination, requested by authority: Secondary | ICD-10-CM | POA: Diagnosis not present

## 2022-11-02 NOTE — Progress Notes (Signed)
Patient removed trach collar and desaturated to 85% while on room air. RT placed trach collar back on patient and explained the importance to keeping it on to maintain his oxygen saturation. Patient verbalized that he understood.

## 2022-11-02 NOTE — Progress Notes (Addendum)
PT Cancellation Note  Patient Details Name: Timothy Black MRN: [id number] DOB: [birth year]   Cancelled Treatment:    Reason Eval/Treat Not Completed: Fatigue/lethargy limiting ability to participate. Pt politely declining, stating he wants to sleep right now. PT to re-attempt as time allows.  1345 addendum: PT re-attempted. Pt sleeping and unwilling to wake to acknowledge therapist in room.   Lorriane Shire 11/02/2022, 10:53 AM
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
## 2022-11-02 NOTE — Progress Notes (Signed)
Inpatient Rehab Admissions Coordinator:    I spoke with pt. And he states that while he will stay in the hospital until medically stable, he does not want to pursue inpatient rehab as he prefers to d/c directly home. I will sign off but MD or CM may reconsult if Pt. Changes his mind.   Clemens Catholic, [place], [place] Admissions Coordinator  [phone] ([place]) [phone] (office)
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
## 2022-11-03 ENCOUNTER — Inpatient Hospital Stay (HOSPITAL_COMMUNITY): Payer: Medicaid Other

## 2022-11-03 DIAGNOSIS — Z046 Encounter for general psychiatric examination, requested by authority: Secondary | ICD-10-CM | POA: Diagnosis not present

## 2022-11-03 DIAGNOSIS — Z9889 Other specified postprocedural states: Secondary | ICD-10-CM | POA: Diagnosis not present

## 2022-11-03 LAB — URINALYSIS, ROUTINE W REFLEX MICROSCOPIC
Glucose, UA: NEGATIVE mg/dL
Hgb urine dipstick: NEGATIVE
Ketones, ur: NEGATIVE mg/dL
Leukocytes,Ua: NEGATIVE
Nitrite: NEGATIVE
Protein, ur: NEGATIVE mg/dL
Specific Gravity, Urine: 1.032 — ABNORMAL HIGH (ref 1.005–1.030)
pH: 5 (ref 5.0–8.0)
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
## 2022-11-03 NOTE — Plan of Care (Signed)
  Problem: Safety: Goal: Non-violent Restraint(s) Outcome: Progressing   
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
## 2022-11-03 NOTE — Progress Notes (Signed)
   11/03/22 1624  Assess: MEWS Score  Temp (!) 102.9 F (39.4 C)  BP 118/76  MAP (mmHg) 88  Pulse Rate (!) 120  ECG Heart Rate (!) 119  Resp 19  Level of Consciousness Alert  SpO2 97 %  O2 Device Tracheostomy Collar  Assess: MEWS Score  MEWS Temp 2  MEWS Systolic 0  MEWS Pulse 2  MEWS RR 0  MEWS LOC 0  MEWS Score 4  MEWS Score Color Red  Assess: SIRS CRITERIA  SIRS Temperature  1  SIRS Pulse 1  SIRS Respirations  0  SIRS WBC 0  SIRS Score Sum  2   Patient has been red mews, MD notified and new orders given

## 2022-11-04 DIAGNOSIS — Z9889 Other specified postprocedural states: Secondary | ICD-10-CM | POA: Diagnosis not present

## 2022-11-04 LAB — COMPREHENSIVE METABOLIC PANEL
ALT: 19 U/L (ref 0–44)
AST: 24 U/L (ref 15–41)
Albumin: 2.2 g/dL — ABNORMAL LOW (ref 3.5–5.0)
Alkaline Phosphatase: 157 U/L — ABNORMAL HIGH (ref 38–126)
Anion gap: 12 (ref 5–15)
BUN: 11 mg/dL (ref 6–20)
CO2: 25 mmol/L (ref 22–32)
Calcium: 8.9 mg/dL (ref 8.9–10.3)
Chloride: 94 mmol/L — ABNORMAL LOW (ref 98–111)
Creatinine, Ser: 0.64 mg/dL (ref 0.61–1.24)
GFR, Estimated: >60 mL/min (ref >60)
Glucose, Bld: 101 mg/dL — ABNORMAL HIGH (ref 70–99)
Potassium: 4.7 mmol/L (ref 3.5–5.1)
Sodium: 131 mmol/L — ABNORMAL LOW (ref 135–145)
Total Bilirubin: 0.4 mg/dL (ref 0.3–1.2)
Total Protein: 7.7 g/dL (ref 6.5–8.1)

## 2022-11-04 LAB — CBC
HCT: 26.8 % — ABNORMAL LOW (ref 39.0–52.0)
Hemoglobin: 8.6 g/dL — ABNORMAL LOW (ref 13.0–17.0)
MCH: 28.1 pg (ref 26.0–34.0)
MCHC: 32.1 g/dL (ref 30.0–36.0)
MCV: 87.6 fL (ref 80.0–100.0)
Platelets: 344 10*3/uL (ref 150–400)
RBC: 3.06 MIL/uL — ABNORMAL LOW (ref 4.22–5.81)
RDW: 13.6 % (ref 11.5–15.5)
WBC: 9 10*3/uL (ref 4.0–10.5)
nRBC: 0 % (ref 0.0–0.2)

## 2022-11-04 MED ORDER — QUETIAPINE FUMARATE 50 MG PO TABS
25.0000 mg | ORAL_TABLET | Freq: Two times a day (BID) | ORAL | Status: DC
  Administered 2022-11-05: 25 mg via ORAL
  Filled 2022-11-04: qty 1

## 2022-11-04 MED ORDER — DIAZEPAM 5 MG PO TABS
5.0000 mg | ORAL_TABLET | Freq: Three times a day (TID) | ORAL | Status: DC
  Administered 2022-11-04 – 2022-11-05 (×2): 5 mg via ORAL
  Filled 2022-11-04 (×2): qty 1

## 2022-11-04 MED ORDER — HYDROMORPHONE HCL 1 MG/ML IJ SOLN
0.2500 mg | Freq: Three times a day (TID) | INTRAMUSCULAR | Status: DC | PRN
  Administered 2022-11-04 – 2022-11-05 (×2): 0.25 mg via INTRAVENOUS
  Filled 2022-11-04 (×2): qty 0.5
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
## 2022-11-04 NOTE — Progress Notes (Signed)
PT Cancellation Note  Patient Details Name: Timothy Black MRN: [id number] DOB: [birth year]   Cancelled Treatment:    Reason Eval/Treat Not Completed: Patient declined, no reason specified;Other (comment). PT attempted to see pt 3 times today, pt initially declining until arrival of his significant other. Pt then with OT on 2nd attempt. Pt reports fatigue on final attempt, declining mobilization at this time. PT will follow up as time allows.   Zenaida Niece 11/04/2022, 4:18 PM
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
## 2022-11-05 ENCOUNTER — Encounter (HOSPITAL_COMMUNITY): Payer: Self-pay

## 2022-11-05 MED ORDER — ENSURE ENLIVE PO LIQD: 237.0000 mL | Freq: Two times a day (BID) | ORAL | Status: DC
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
## 2022-11-05 NOTE — Progress Notes (Signed)
Patient leaving against medical advice. Staff explained the risk of leaving against medical advice but patient still choosing to leave. Patient given what trach supplies were in his room. Patient states "he'll go to the emergency room to get the trach removed".  Patient in no acute distress at time, able to walk out on his own.
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
## 2022-11-05 NOTE — Procedures (Signed)
Tracheostomy Change Note  Patient Details:   Name: Timothy Black DOB: [birth year] MRN: [id number]    Airway Documentation:     Evaluation  O2 sats: stable throughout Complications: No apparent complications Patient did tolerate procedure well. Bilateral Breath Sounds: Clear, Diminished    RRT x2 changed patients trach from #6 shiley cuffless to #4 shiley cuffless. Positive color changed noted. Patient tolerated well.   Fabiola Backer 11/05/2022, 10:22 AM
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
## 2022-11-05 NOTE — Progress Notes (Signed)
RT capped patients trach per MD order @1018 . Patient tolerating well and VS stable.

## 2022-11-06 LAB — CULTURE, RESPIRATORY W GRAM STAIN: Culture: NORMAL

## 2022-11-08 LAB — CULTURE, BLOOD (ROUTINE X 2)
Culture: NO GROWTH
Special Requests: ADEQUATE
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
## 2022-11-10 LAB — CULTURE, BLOOD (ROUTINE X 2): Special Requests: ADEQUATE
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
## 2022-11-19 NOTE — Progress Notes (Signed)
Pneumonia developed after admission, was treated, and resolved by the time of D/C

## 2023-01-10 ENCOUNTER — Encounter (HOSPITAL_COMMUNITY): Payer: Self-pay

## 2023-01-10 ENCOUNTER — Emergency Department (HOSPITAL_COMMUNITY)
Admission: EM | Admit: 2023-01-10 | Discharge: 2023-01-10 | Payer: Medicaid Other | Attending: Emergency Medicine | Admitting: Emergency Medicine

## 2023-01-10 ENCOUNTER — Other Ambulatory Visit: Payer: Self-pay

## 2023-01-10 DIAGNOSIS — F1110 Opioid abuse, uncomplicated: Secondary | ICD-10-CM | POA: Insufficient documentation

## 2023-01-10 DIAGNOSIS — R7309 Other abnormal glucose: Secondary | ICD-10-CM | POA: Diagnosis not present

## 2023-01-10 DIAGNOSIS — F1910 Other psychoactive substance abuse, uncomplicated: Secondary | ICD-10-CM

## 2023-01-10 LAB — CBG MONITORING, ED: Glucose-Capillary: 100 mg/dL — ABNORMAL HIGH (ref 70–99)
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
## 2023-01-10 NOTE — Discharge Instructions (Signed)
Continue taking your methadone and follow-up as needed
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
